# Patient Record
Sex: Male | Born: 1950 | Race: White | Hispanic: No | Marital: Married | Smoking: Never smoker
Health system: Southern US, Community
[De-identification: ages and names within clinical notes are randomized; demographics above are authoritative.]

## PROBLEM LIST (undated history)

## (undated) DIAGNOSIS — E7800 Pure hypercholesterolemia, unspecified: Secondary | ICD-10-CM

## (undated) DIAGNOSIS — F32A Depression, unspecified: Secondary | ICD-10-CM

## (undated) DIAGNOSIS — M1712 Unilateral primary osteoarthritis, left knee: Secondary | ICD-10-CM

## (undated) DIAGNOSIS — E119 Type 2 diabetes mellitus without complications: Secondary | ICD-10-CM

## (undated) DIAGNOSIS — F329 Major depressive disorder, single episode, unspecified: Secondary | ICD-10-CM

## (undated) DIAGNOSIS — E781 Pure hyperglyceridemia: Secondary | ICD-10-CM

## (undated) DIAGNOSIS — F419 Anxiety disorder, unspecified: Secondary | ICD-10-CM

## (undated) DIAGNOSIS — Z87442 Personal history of urinary calculi: Secondary | ICD-10-CM

## (undated) DIAGNOSIS — C4490 Unspecified malignant neoplasm of skin, unspecified: Secondary | ICD-10-CM

## (undated) DIAGNOSIS — L03115 Cellulitis of right lower limb: Secondary | ICD-10-CM

## (undated) DIAGNOSIS — R350 Frequency of micturition: Secondary | ICD-10-CM

## (undated) DIAGNOSIS — I10 Essential (primary) hypertension: Secondary | ICD-10-CM

## (undated) HISTORY — PX: ESOPHAGOGASTRODUODENOSCOPY: SHX1529

## (undated) HISTORY — PX: KNEE SURGERY: SHX244

## (undated) HISTORY — PX: CARDIAC CATHETERIZATION: SHX172

## (undated) HISTORY — PX: EYE SURGERY: SHX253

## (undated) HISTORY — DX: Unilateral primary osteoarthritis, left knee: M17.12

## (undated) HISTORY — PX: KIDNEY STONE SURGERY: SHX686

---

## 2001-03-10 ENCOUNTER — Encounter: Payer: Self-pay | Admitting: Family Medicine

## 2001-03-10 ENCOUNTER — Ambulatory Visit (HOSPITAL_COMMUNITY): Admission: RE | Admit: 2001-03-10 | Discharge: 2001-03-10 | Payer: Self-pay | Admitting: Family Medicine

## 2002-01-06 ENCOUNTER — Ambulatory Visit (HOSPITAL_COMMUNITY): Admission: RE | Admit: 2002-01-06 | Discharge: 2002-01-06 | Payer: Self-pay | Admitting: General Surgery

## 2002-03-31 ENCOUNTER — Other Ambulatory Visit: Admission: RE | Admit: 2002-03-31 | Discharge: 2002-03-31 | Payer: Self-pay | Admitting: General Surgery

## 2002-06-16 ENCOUNTER — Ambulatory Visit (HOSPITAL_COMMUNITY): Admission: RE | Admit: 2002-06-16 | Discharge: 2002-06-16 | Payer: Self-pay | Admitting: Cardiology

## 2002-06-16 ENCOUNTER — Encounter: Payer: Self-pay | Admitting: Cardiology

## 2003-04-26 ENCOUNTER — Encounter: Payer: Self-pay | Admitting: *Deleted

## 2003-04-26 ENCOUNTER — Emergency Department (HOSPITAL_COMMUNITY): Admission: EM | Admit: 2003-04-26 | Discharge: 2003-04-26 | Payer: Self-pay | Admitting: Emergency Medicine

## 2003-04-30 ENCOUNTER — Ambulatory Visit (HOSPITAL_COMMUNITY): Admission: RE | Admit: 2003-04-30 | Discharge: 2003-04-30 | Payer: Self-pay | Admitting: General Surgery

## 2003-04-30 ENCOUNTER — Encounter: Payer: Self-pay | Admitting: General Surgery

## 2003-04-30 ENCOUNTER — Encounter: Payer: Self-pay | Admitting: Family Medicine

## 2003-04-30 ENCOUNTER — Ambulatory Visit (HOSPITAL_COMMUNITY): Admission: RE | Admit: 2003-04-30 | Discharge: 2003-04-30 | Payer: Self-pay | Admitting: Family Medicine

## 2003-05-06 ENCOUNTER — Ambulatory Visit (HOSPITAL_COMMUNITY): Admission: RE | Admit: 2003-05-06 | Discharge: 2003-05-06 | Payer: Self-pay | Admitting: General Surgery

## 2003-08-12 ENCOUNTER — Ambulatory Visit (HOSPITAL_COMMUNITY): Admission: RE | Admit: 2003-08-12 | Discharge: 2003-08-12 | Payer: Self-pay | Admitting: Cardiology

## 2004-11-07 ENCOUNTER — Ambulatory Visit (HOSPITAL_COMMUNITY): Admission: RE | Admit: 2004-11-07 | Discharge: 2004-11-07 | Payer: Self-pay | Admitting: *Deleted

## 2004-11-07 ENCOUNTER — Ambulatory Visit: Payer: Self-pay | Admitting: *Deleted

## 2004-11-10 ENCOUNTER — Inpatient Hospital Stay (HOSPITAL_BASED_OUTPATIENT_CLINIC_OR_DEPARTMENT_OTHER): Admission: RE | Admit: 2004-11-10 | Discharge: 2004-11-10 | Payer: Self-pay | Admitting: Cardiology

## 2004-11-10 ENCOUNTER — Ambulatory Visit: Payer: Self-pay | Admitting: Cardiology

## 2004-11-24 ENCOUNTER — Ambulatory Visit: Payer: Self-pay | Admitting: *Deleted

## 2007-09-26 ENCOUNTER — Ambulatory Visit (HOSPITAL_COMMUNITY): Admission: RE | Admit: 2007-09-26 | Discharge: 2007-09-26 | Payer: Self-pay | Admitting: General Surgery

## 2007-09-26 ENCOUNTER — Encounter (INDEPENDENT_AMBULATORY_CARE_PROVIDER_SITE_OTHER): Payer: Self-pay | Admitting: General Surgery

## 2008-11-29 ENCOUNTER — Emergency Department (HOSPITAL_COMMUNITY): Admission: EM | Admit: 2008-11-29 | Discharge: 2008-11-29 | Payer: Self-pay | Admitting: Emergency Medicine

## 2008-12-17 ENCOUNTER — Ambulatory Visit: Payer: Self-pay | Admitting: Cardiovascular Disease

## 2009-02-14 ENCOUNTER — Encounter: Payer: Self-pay | Admitting: Cardiovascular Disease

## 2009-03-02 ENCOUNTER — Ambulatory Visit (HOSPITAL_COMMUNITY): Admission: RE | Admit: 2009-03-02 | Discharge: 2009-03-02 | Payer: Self-pay | Admitting: Family Medicine

## 2009-03-10 ENCOUNTER — Telehealth: Payer: Self-pay | Admitting: Cardiovascular Disease

## 2009-03-11 ENCOUNTER — Emergency Department (HOSPITAL_COMMUNITY): Admission: EM | Admit: 2009-03-11 | Discharge: 2009-03-11 | Payer: Self-pay | Admitting: Emergency Medicine

## 2009-03-14 ENCOUNTER — Ambulatory Visit: Payer: Self-pay | Admitting: Cardiology

## 2009-03-15 ENCOUNTER — Encounter (INDEPENDENT_AMBULATORY_CARE_PROVIDER_SITE_OTHER): Payer: Self-pay | Admitting: *Deleted

## 2009-03-18 ENCOUNTER — Telehealth: Payer: Self-pay | Admitting: Gastroenterology

## 2009-03-21 ENCOUNTER — Encounter: Payer: Self-pay | Admitting: Cardiovascular Disease

## 2009-03-23 ENCOUNTER — Encounter: Payer: Self-pay | Admitting: Gastroenterology

## 2009-03-23 ENCOUNTER — Ambulatory Visit (HOSPITAL_COMMUNITY): Admission: RE | Admit: 2009-03-23 | Discharge: 2009-03-23 | Payer: Self-pay | Admitting: Gastroenterology

## 2009-03-23 ENCOUNTER — Ambulatory Visit: Payer: Self-pay | Admitting: Gastroenterology

## 2009-03-23 DIAGNOSIS — R630 Anorexia: Secondary | ICD-10-CM

## 2009-03-23 DIAGNOSIS — R12 Heartburn: Secondary | ICD-10-CM

## 2009-03-23 DIAGNOSIS — E785 Hyperlipidemia, unspecified: Secondary | ICD-10-CM | POA: Insufficient documentation

## 2009-03-23 DIAGNOSIS — R634 Abnormal weight loss: Secondary | ICD-10-CM

## 2009-03-23 DIAGNOSIS — K449 Diaphragmatic hernia without obstruction or gangrene: Secondary | ICD-10-CM | POA: Insufficient documentation

## 2009-03-23 DIAGNOSIS — K7689 Other specified diseases of liver: Secondary | ICD-10-CM

## 2009-03-23 DIAGNOSIS — I10 Essential (primary) hypertension: Secondary | ICD-10-CM | POA: Insufficient documentation

## 2009-03-23 DIAGNOSIS — K219 Gastro-esophageal reflux disease without esophagitis: Secondary | ICD-10-CM

## 2009-03-23 DIAGNOSIS — R0789 Other chest pain: Secondary | ICD-10-CM

## 2009-03-23 DIAGNOSIS — G4730 Sleep apnea, unspecified: Secondary | ICD-10-CM | POA: Insufficient documentation

## 2009-03-23 DIAGNOSIS — F329 Major depressive disorder, single episode, unspecified: Secondary | ICD-10-CM

## 2009-03-23 DIAGNOSIS — K5900 Constipation, unspecified: Secondary | ICD-10-CM | POA: Insufficient documentation

## 2009-03-23 DIAGNOSIS — R1012 Left upper quadrant pain: Secondary | ICD-10-CM | POA: Insufficient documentation

## 2009-03-23 DIAGNOSIS — E119 Type 2 diabetes mellitus without complications: Secondary | ICD-10-CM

## 2009-03-24 ENCOUNTER — Encounter: Payer: Self-pay | Admitting: Gastroenterology

## 2009-03-25 ENCOUNTER — Encounter: Payer: Self-pay | Admitting: Cardiovascular Disease

## 2009-03-25 ENCOUNTER — Ambulatory Visit: Payer: Self-pay | Admitting: Gastroenterology

## 2009-03-28 ENCOUNTER — Encounter: Payer: Self-pay | Admitting: Gastroenterology

## 2009-03-29 ENCOUNTER — Inpatient Hospital Stay (HOSPITAL_COMMUNITY): Admission: EM | Admit: 2009-03-29 | Discharge: 2009-03-31 | Payer: Self-pay | Admitting: Emergency Medicine

## 2009-03-29 ENCOUNTER — Telehealth: Payer: Self-pay | Admitting: Gastroenterology

## 2009-03-29 ENCOUNTER — Ambulatory Visit: Payer: Self-pay | Admitting: Internal Medicine

## 2009-04-08 ENCOUNTER — Ambulatory Visit: Payer: Self-pay | Admitting: Cardiovascular Disease

## 2009-04-22 ENCOUNTER — Ambulatory Visit (HOSPITAL_COMMUNITY): Payer: Self-pay | Admitting: Psychology

## 2009-04-26 ENCOUNTER — Ambulatory Visit (HOSPITAL_COMMUNITY): Admission: RE | Admit: 2009-04-26 | Discharge: 2009-04-26 | Payer: Self-pay | Admitting: Orthopedic Surgery

## 2009-05-19 ENCOUNTER — Ambulatory Visit: Payer: Self-pay | Admitting: Pulmonary Disease

## 2009-05-19 ENCOUNTER — Telehealth: Payer: Self-pay | Admitting: Pulmonary Disease

## 2009-05-19 DIAGNOSIS — G4700 Insomnia, unspecified: Secondary | ICD-10-CM | POA: Insufficient documentation

## 2009-05-23 ENCOUNTER — Encounter: Payer: Self-pay | Admitting: Pulmonary Disease

## 2009-05-25 ENCOUNTER — Ambulatory Visit (HOSPITAL_COMMUNITY): Admission: RE | Admit: 2009-05-25 | Discharge: 2009-05-25 | Payer: Self-pay | Admitting: Family Medicine

## 2009-07-07 ENCOUNTER — Ambulatory Visit: Payer: Self-pay | Admitting: Gastroenterology

## 2009-07-07 DIAGNOSIS — R198 Other specified symptoms and signs involving the digestive system and abdomen: Secondary | ICD-10-CM

## 2009-07-11 ENCOUNTER — Ambulatory Visit (HOSPITAL_COMMUNITY): Admission: RE | Admit: 2009-07-11 | Discharge: 2009-07-11 | Payer: Self-pay | Admitting: Gastroenterology

## 2009-07-11 ENCOUNTER — Ambulatory Visit: Payer: Self-pay | Admitting: Gastroenterology

## 2009-07-13 ENCOUNTER — Encounter: Payer: Self-pay | Admitting: Gastroenterology

## 2009-08-31 ENCOUNTER — Telehealth (INDEPENDENT_AMBULATORY_CARE_PROVIDER_SITE_OTHER): Payer: Self-pay | Admitting: *Deleted

## 2009-09-05 ENCOUNTER — Inpatient Hospital Stay (HOSPITAL_COMMUNITY): Admission: RE | Admit: 2009-09-05 | Discharge: 2009-09-07 | Payer: Self-pay | Admitting: Orthopedic Surgery

## 2009-09-10 ENCOUNTER — Ambulatory Visit: Payer: Self-pay | Admitting: Surgery

## 2009-09-10 ENCOUNTER — Encounter: Payer: Self-pay | Admitting: Internal Medicine

## 2009-09-10 ENCOUNTER — Inpatient Hospital Stay (HOSPITAL_COMMUNITY): Admission: EM | Admit: 2009-09-10 | Discharge: 2009-09-15 | Payer: Self-pay | Admitting: Emergency Medicine

## 2009-09-15 ENCOUNTER — Ambulatory Visit: Payer: Self-pay | Admitting: Infectious Diseases

## 2009-10-26 ENCOUNTER — Encounter (HOSPITAL_COMMUNITY): Admission: RE | Admit: 2009-10-26 | Discharge: 2009-11-25 | Payer: Self-pay | Admitting: Orthopedic Surgery

## 2009-11-28 ENCOUNTER — Encounter (HOSPITAL_COMMUNITY): Admission: RE | Admit: 2009-11-28 | Discharge: 2009-12-28 | Payer: Self-pay | Admitting: Orthopedic Surgery

## 2010-10-16 ENCOUNTER — Other Ambulatory Visit
Admission: RE | Admit: 2010-10-16 | Discharge: 2010-10-16 | Payer: Self-pay | Source: Home / Self Care | Admitting: General Surgery

## 2010-10-29 ENCOUNTER — Encounter: Payer: Self-pay | Admitting: Cardiovascular Disease

## 2010-10-29 ENCOUNTER — Encounter: Payer: Self-pay | Admitting: Family Medicine

## 2010-10-30 ENCOUNTER — Encounter: Payer: Self-pay | Admitting: Internal Medicine

## 2011-01-09 LAB — URINALYSIS, ROUTINE W REFLEX MICROSCOPIC
Bilirubin Urine: NEGATIVE
Ketones, ur: NEGATIVE mg/dL
Nitrite: NEGATIVE
pH: 6 (ref 5.0–8.0)

## 2011-01-09 LAB — GLUCOSE, CAPILLARY
Glucose-Capillary: 101 mg/dL — ABNORMAL HIGH (ref 70–99)
Glucose-Capillary: 102 mg/dL — ABNORMAL HIGH (ref 70–99)
Glucose-Capillary: 104 mg/dL — ABNORMAL HIGH (ref 70–99)
Glucose-Capillary: 109 mg/dL — ABNORMAL HIGH (ref 70–99)
Glucose-Capillary: 110 mg/dL — ABNORMAL HIGH (ref 70–99)
Glucose-Capillary: 113 mg/dL — ABNORMAL HIGH (ref 70–99)
Glucose-Capillary: 115 mg/dL — ABNORMAL HIGH (ref 70–99)
Glucose-Capillary: 121 mg/dL — ABNORMAL HIGH (ref 70–99)
Glucose-Capillary: 124 mg/dL — ABNORMAL HIGH (ref 70–99)
Glucose-Capillary: 128 mg/dL — ABNORMAL HIGH (ref 70–99)
Glucose-Capillary: 156 mg/dL — ABNORMAL HIGH (ref 70–99)
Glucose-Capillary: 87 mg/dL (ref 70–99)
Glucose-Capillary: 97 mg/dL (ref 70–99)

## 2011-01-09 LAB — BASIC METABOLIC PANEL
BUN: 4 mg/dL — ABNORMAL LOW (ref 6–23)
BUN: 8 mg/dL (ref 6–23)
CO2: 25 mEq/L (ref 19–32)
Calcium: 8.6 mg/dL (ref 8.4–10.5)
Chloride: 108 mEq/L (ref 96–112)
Chloride: 110 mEq/L (ref 96–112)
GFR calc Af Amer: >60 mL/min (ref >60)
GFR calc non Af Amer: >60 mL/min (ref >60)
Glucose, Bld: 104 mg/dL — ABNORMAL HIGH (ref 70–99)
Glucose, Bld: 125 mg/dL — ABNORMAL HIGH (ref 70–99)
Glucose, Bld: 80 mg/dL (ref 70–99)
Potassium: 3.2 mEq/L — ABNORMAL LOW (ref 3.5–5.1)
Potassium: 3.6 mEq/L (ref 3.5–5.1)
Potassium: 3.8 mEq/L (ref 3.5–5.1)
Potassium: 4.1 mEq/L (ref 3.5–5.1)
Sodium: 135 mEq/L (ref 135–145)
Sodium: 136 mEq/L (ref 135–145)
Sodium: 142 mEq/L (ref 135–145)

## 2011-01-09 LAB — CBC
HCT: 27.9 % — ABNORMAL LOW (ref 39.0–52.0)
HCT: 28.4 % — ABNORMAL LOW (ref 39.0–52.0)
HCT: 30.2 % — ABNORMAL LOW (ref 39.0–52.0)
HCT: 31.9 % — ABNORMAL LOW (ref 39.0–52.0)
HCT: 36.1 % — ABNORMAL LOW (ref 39.0–52.0)
Hemoglobin: 11.1 g/dL — ABNORMAL LOW (ref 13.0–17.0)
Hemoglobin: 12.7 g/dL — ABNORMAL LOW (ref 13.0–17.0)
Hemoglobin: 9.5 g/dL — ABNORMAL LOW (ref 13.0–17.0)
Hemoglobin: 9.7 g/dL — ABNORMAL LOW (ref 13.0–17.0)
Hemoglobin: 9.7 g/dL — ABNORMAL LOW (ref 13.0–17.0)
MCHC: 34.8 g/dL (ref 30.0–36.0)
MCHC: 35.2 g/dL (ref 30.0–36.0)
MCV: 92.3 fL (ref 78.0–100.0)
MCV: 92.6 fL (ref 78.0–100.0)
MCV: 94.4 fL (ref 78.0–100.0)
Platelets: 235 10*3/uL (ref 150–400)
Platelets: 346 10*3/uL (ref 150–400)
Platelets: 363 10*3/uL (ref 150–400)
RBC: 3.01 MIL/uL — ABNORMAL LOW (ref 4.22–5.81)
RBC: 3.02 MIL/uL — ABNORMAL LOW (ref 4.22–5.81)
RBC: 3.4 MIL/uL — ABNORMAL LOW (ref 4.22–5.81)
RDW: 13.2 % (ref 11.5–15.5)
RDW: 13.4 % (ref 11.5–15.5)
RDW: 13.4 % (ref 11.5–15.5)
RDW: 13.6 % (ref 11.5–15.5)
WBC: 6.8 10*3/uL (ref 4.0–10.5)
WBC: 8.2 10*3/uL (ref 4.0–10.5)
WBC: 8.3 10*3/uL (ref 4.0–10.5)

## 2011-01-09 LAB — DIFFERENTIAL
Basophils Absolute: 0 10*3/uL (ref 0.0–0.1)
Basophils Absolute: 0.1 10*3/uL (ref 0.0–0.1)
Basophils Absolute: 0.1 10*3/uL (ref 0.0–0.1)
Basophils Relative: 1 % (ref 0–1)
Basophils Relative: 1 % (ref 0–1)
Eosinophils Absolute: 0.2 10*3/uL (ref 0.0–0.7)
Eosinophils Absolute: 0.2 10*3/uL (ref 0.0–0.7)
Eosinophils Relative: 2 % (ref 0–5)
Eosinophils Relative: 3 % (ref 0–5)
Eosinophils Relative: 3 % (ref 0–5)
Lymphocytes Relative: 20 % (ref 12–46)
Lymphs Abs: 1.7 10*3/uL (ref 0.7–4.0)
Lymphs Abs: 1.8 10*3/uL (ref 0.7–4.0)
Monocytes Absolute: 0.6 10*3/uL (ref 0.1–1.0)
Monocytes Absolute: 0.6 10*3/uL (ref 0.1–1.0)
Monocytes Relative: 7 % (ref 3–12)
Monocytes Relative: 8 % (ref 3–12)
Neutro Abs: 4.9 10*3/uL (ref 1.7–7.7)
Neutro Abs: 5.4 10*3/uL (ref 1.7–7.7)
Neutro Abs: 5.6 10*3/uL (ref 1.7–7.7)

## 2011-01-09 LAB — SEDIMENTATION RATE: Sed Rate: 92 mm/hr — ABNORMAL HIGH (ref 0–16)

## 2011-01-09 LAB — COMPREHENSIVE METABOLIC PANEL
ALT: 45 U/L (ref 0–53)
Albumin: 2.7 g/dL — ABNORMAL LOW (ref 3.5–5.2)
Alkaline Phosphatase: 37 U/L — ABNORMAL LOW (ref 39–117)
Chloride: 104 mEq/L (ref 96–112)
Glucose, Bld: 110 mg/dL — ABNORMAL HIGH (ref 70–99)
Potassium: 3.5 mEq/L (ref 3.5–5.1)
Sodium: 138 mEq/L (ref 135–145)
Total Bilirubin: 0.9 mg/dL (ref 0.3–1.2)
Total Protein: 5.7 g/dL — ABNORMAL LOW (ref 6.0–8.3)

## 2011-01-09 LAB — CULTURE, BLOOD (ROUTINE X 2): Culture: NO GROWTH

## 2011-01-09 LAB — HIGH SENSITIVITY CRP: CRP, High Sensitivity: 141.2 mg/L — ABNORMAL HIGH

## 2011-01-09 LAB — URINE MICROSCOPIC-ADD ON

## 2011-01-09 LAB — CLOSTRIDIUM DIFFICILE EIA

## 2011-01-09 LAB — VANCOMYCIN, TROUGH: Vancomycin Tr: 13.5 ug/mL (ref 10.0–20.0)

## 2011-01-09 LAB — APTT: aPTT: 44 seconds — ABNORMAL HIGH (ref 24–37)

## 2011-01-09 LAB — URINE CULTURE

## 2011-01-09 LAB — PROTIME-INR
INR: 1.92 — ABNORMAL HIGH (ref 0.00–1.49)
Prothrombin Time: 21.8 seconds — ABNORMAL HIGH (ref 11.6–15.2)

## 2011-01-09 LAB — HEMOGLOBIN A1C: Hgb A1c MFr Bld: 6 % (ref 4.6–6.1)

## 2011-01-10 LAB — DIFFERENTIAL
Basophils Absolute: 0.2 10*3/uL — ABNORMAL HIGH (ref 0.0–0.1)
Lymphocytes Relative: 28 % (ref 12–46)
Monocytes Absolute: 0.8 10*3/uL (ref 0.1–1.0)
Neutro Abs: 7.1 10*3/uL (ref 1.7–7.7)

## 2011-01-10 LAB — URINALYSIS, ROUTINE W REFLEX MICROSCOPIC
Glucose, UA: NEGATIVE mg/dL
Ketones, ur: NEGATIVE mg/dL
Ketones, ur: NEGATIVE mg/dL
Nitrite: NEGATIVE
Nitrite: NEGATIVE
Protein, ur: 30 mg/dL — AB
Protein, ur: NEGATIVE mg/dL
Urobilinogen, UA: 1 mg/dL (ref 0.0–1.0)
Urobilinogen, UA: 1 mg/dL (ref 0.0–1.0)
pH: 6 (ref 5.0–8.0)

## 2011-01-10 LAB — ABO/RH: ABO/RH(D): A POS

## 2011-01-10 LAB — COMPREHENSIVE METABOLIC PANEL
Albumin: 4.2 g/dL (ref 3.5–5.2)
BUN: 17 mg/dL (ref 6–23)
Chloride: 107 mEq/L (ref 96–112)
Creatinine, Ser: 1 mg/dL (ref 0.4–1.5)
Total Bilirubin: 0.6 mg/dL (ref 0.3–1.2)

## 2011-01-10 LAB — CBC
HCT: 36.4 % — ABNORMAL LOW (ref 39.0–52.0)
HCT: 41.7 % (ref 39.0–52.0)
Hemoglobin: 12.6 g/dL — ABNORMAL LOW (ref 13.0–17.0)
MCHC: 34.3 g/dL (ref 30.0–36.0)
MCV: 93.2 fL (ref 78.0–100.0)
MCV: 93.5 fL (ref 78.0–100.0)
Platelets: 224 10*3/uL (ref 150–400)
Platelets: 295 10*3/uL (ref 150–400)
RDW: 13.7 % (ref 11.5–15.5)
RDW: 13.7 % (ref 11.5–15.5)

## 2011-01-10 LAB — GLUCOSE, CAPILLARY
Glucose-Capillary: 115 mg/dL — ABNORMAL HIGH (ref 70–99)
Glucose-Capillary: 115 mg/dL — ABNORMAL HIGH (ref 70–99)
Glucose-Capillary: 98 mg/dL (ref 70–99)
Glucose-Capillary: 99 mg/dL (ref 70–99)
Glucose-Capillary: 99 mg/dL (ref 70–99)

## 2011-01-10 LAB — URINE MICROSCOPIC-ADD ON

## 2011-01-10 LAB — APTT: aPTT: 27 seconds (ref 24–37)

## 2011-01-10 LAB — URINE CULTURE
Colony Count: NO GROWTH
Culture: NO GROWTH

## 2011-01-10 LAB — BASIC METABOLIC PANEL
BUN: 11 mg/dL (ref 6–23)
Chloride: 103 mEq/L (ref 96–112)
Glucose, Bld: 134 mg/dL — ABNORMAL HIGH (ref 70–99)
Potassium: 3.9 mEq/L (ref 3.5–5.1)

## 2011-01-10 LAB — CULTURE, BLOOD (ROUTINE X 2)

## 2011-01-10 LAB — PROTIME-INR: Prothrombin Time: 13 seconds (ref 11.6–15.2)

## 2011-01-10 LAB — TYPE AND SCREEN: Antibody Screen: NEGATIVE

## 2011-01-14 LAB — URINALYSIS, ROUTINE W REFLEX MICROSCOPIC
Ketones, ur: NEGATIVE mg/dL
Nitrite: NEGATIVE
Protein, ur: NEGATIVE mg/dL
pH: 5.5 (ref 5.0–8.0)

## 2011-01-14 LAB — CBC
HCT: 39.9 % (ref 39.0–52.0)
MCHC: 34.4 g/dL (ref 30.0–36.0)
MCV: 91.2 fL (ref 78.0–100.0)
Platelets: 339 10*3/uL (ref 150–400)
RDW: 13 % (ref 11.5–15.5)
WBC: 15.3 10*3/uL — ABNORMAL HIGH (ref 4.0–10.5)

## 2011-01-14 LAB — COMPREHENSIVE METABOLIC PANEL
Albumin: 4 g/dL (ref 3.5–5.2)
BUN: 13 mg/dL (ref 6–23)
Calcium: 9.7 mg/dL (ref 8.4–10.5)
Chloride: 104 mEq/L (ref 96–112)
Creatinine, Ser: 1.08 mg/dL (ref 0.4–1.5)
GFR calc Af Amer: >60 mL/min (ref >60)
Total Bilirubin: 0.5 mg/dL (ref 0.3–1.2)

## 2011-01-14 LAB — DIFFERENTIAL
Basophils Absolute: 0.1 10*3/uL (ref 0.0–0.1)
Lymphocytes Relative: 23 % (ref 12–46)
Lymphs Abs: 3.4 10*3/uL (ref 0.7–4.0)
Monocytes Absolute: 0.7 10*3/uL (ref 0.1–1.0)
Neutro Abs: 10.8 10*3/uL — ABNORMAL HIGH (ref 1.7–7.7)

## 2011-01-14 LAB — PROTIME-INR: Prothrombin Time: 13.3 seconds (ref 11.6–15.2)

## 2011-01-14 LAB — APTT: aPTT: 26 seconds (ref 24–37)

## 2011-01-15 LAB — POCT I-STAT 3, VENOUS BLOOD GAS (G3P V)
Bicarbonate: 24.9 mEq/L — ABNORMAL HIGH (ref 20.0–24.0)
pH, Ven: 7.365 — ABNORMAL HIGH (ref 7.250–7.300)

## 2011-01-15 LAB — DIFFERENTIAL
Eosinophils Absolute: 0.2 10*3/uL (ref 0.0–0.7)
Eosinophils Relative: 1 % (ref 0–5)
Eosinophils Relative: 1 % (ref 0–5)
Lymphocytes Relative: 25 % (ref 12–46)
Lymphs Abs: 3.1 10*3/uL (ref 0.7–4.0)
Lymphs Abs: 3.1 10*3/uL (ref 0.7–4.0)
Monocytes Absolute: 0.8 10*3/uL (ref 0.1–1.0)
Monocytes Relative: 6 % (ref 3–12)
Monocytes Relative: 6 % (ref 3–12)

## 2011-01-15 LAB — COMPREHENSIVE METABOLIC PANEL
AST: 28 U/L (ref 0–37)
Albumin: 3.7 g/dL (ref 3.5–5.2)
Calcium: 9 mg/dL (ref 8.4–10.5)
Chloride: 107 mEq/L (ref 96–112)
Creatinine, Ser: 1.1 mg/dL (ref 0.4–1.5)
GFR calc Af Amer: >60 mL/min (ref >60)
Total Bilirubin: 0.3 mg/dL (ref 0.3–1.2)

## 2011-01-15 LAB — BASIC METABOLIC PANEL
Chloride: 103 mEq/L (ref 96–112)
GFR calc Af Amer: >60 mL/min (ref >60)
Potassium: 3.9 mEq/L (ref 3.5–5.1)
Sodium: 137 mEq/L (ref 135–145)

## 2011-01-15 LAB — LIPID PANEL: Triglycerides: 146 mg/dL (ref <150)

## 2011-01-15 LAB — CBC
HCT: 41.4 % (ref 39.0–52.0)
HCT: 40.7 % (ref 39.0–52.0)
Hemoglobin: 14.1 g/dL (ref 13.0–17.0)
MCV: 91.6 fL (ref 78.0–100.0)
RBC: 4.44 MIL/uL (ref 4.22–5.81)
WBC: 12.4 10*3/uL — ABNORMAL HIGH (ref 4.0–10.5)
WBC: 11.5 10*3/uL — ABNORMAL HIGH (ref 4.0–10.5)

## 2011-01-15 LAB — POCT I-STAT, CHEM 8
BUN: 12 mg/dL (ref 6–23)
Calcium, Ion: 1.16 mmol/L (ref 1.12–1.32)
Creatinine, Ser: 1 mg/dL (ref 0.4–1.5)
Glucose, Bld: 118 mg/dL — ABNORMAL HIGH (ref 70–99)
Sodium: 136 mEq/L (ref 135–145)
TCO2: 24 mmol/L (ref 0–100)

## 2011-01-15 LAB — POCT I-STAT 3, ART BLOOD GAS (G3+)
Bicarbonate: 25.6 mEq/L — ABNORMAL HIGH (ref 20.0–24.0)
pH, Arterial: 7.386 (ref 7.350–7.450)
pO2, Arterial: 100 mmHg (ref 80.0–100.0)

## 2011-01-15 LAB — GLUCOSE, CAPILLARY: Glucose-Capillary: 101 mg/dL — ABNORMAL HIGH (ref 70–99)

## 2011-01-15 LAB — CARDIAC PANEL(CRET KIN+CKTOT+MB+TROPI)
CK, MB: 1.5 ng/mL (ref 0.3–4.0)
CK, MB: 1.6 ng/mL (ref 0.3–4.0)
Troponin I: 0.01 ng/mL (ref 0.00–0.06)

## 2011-01-15 LAB — POCT CARDIAC MARKERS
Myoglobin, poc: 53.6 ng/mL (ref 12–200)
Myoglobin, poc: 75.8 ng/mL (ref 12–200)
Myoglobin, poc: 62.7 ng/mL (ref 12–200)

## 2011-01-15 LAB — APTT: aPTT: 27 seconds (ref 24–37)

## 2011-01-15 LAB — PROTIME-INR
INR: 1.1 (ref 0.00–1.49)
Prothrombin Time: 14.1 seconds (ref 11.6–15.2)

## 2011-01-15 LAB — BRAIN NATRIURETIC PEPTIDE: Pro B Natriuretic peptide (BNP): 33 pg/mL (ref 0.0–100.0)

## 2011-01-15 LAB — HEMOGLOBIN A1C: Mean Plasma Glucose: 140 mg/dL

## 2011-01-15 LAB — TSH: TSH: 1.789 u[IU]/mL (ref 0.350–4.500)

## 2011-01-23 LAB — DIFFERENTIAL
Basophils Relative: 1 % (ref 0–1)
Lymphocytes Relative: 29 % (ref 12–46)
Lymphs Abs: 3.2 10*3/uL (ref 0.7–4.0)
Monocytes Absolute: 0.7 10*3/uL (ref 0.1–1.0)
Monocytes Relative: 6 % (ref 3–12)
Neutro Abs: 7 10*3/uL (ref 1.7–7.7)

## 2011-01-23 LAB — BASIC METABOLIC PANEL
CO2: 25 mEq/L (ref 19–32)
Calcium: 9.2 mg/dL (ref 8.4–10.5)
GFR calc Af Amer: >60 mL/min (ref >60)
Sodium: 136 mEq/L (ref 135–145)

## 2011-01-23 LAB — POCT CARDIAC MARKERS
CKMB, poc: <1 ng/mL — ABNORMAL LOW (ref 1.0–8.0)
Myoglobin, poc: 60.1 ng/mL (ref 12–200)
Myoglobin, poc: 81.2 ng/mL (ref 12–200)
Troponin i, poc: <0.05 ng/mL (ref 0.00–0.09)

## 2011-01-23 LAB — CBC
Hemoglobin: 13.4 g/dL (ref 13.0–17.0)
MCHC: 34.9 g/dL (ref 30.0–36.0)
RBC: 4.26 MIL/uL (ref 4.22–5.81)
WBC: 11.2 10*3/uL — ABNORMAL HIGH (ref 4.0–10.5)

## 2011-02-20 NOTE — Op Note (Signed)
NAME:  [name], [name] NO.:  [id number]   MEDICAL RECORD NO.:  [id number]          PATIENT TYPE:  AMB   LOCATION:  DAY                           FACILITY:  [facility]   PHYSICIAN:  Kassie Mends, M.D.      DATE OF BIRTH:  Feb 27, 1951   DATE OF PROCEDURE:  03/23/2009  DATE OF DISCHARGE:  03/23/2009                               OPERATIVE REPORT   PROCEDURE:  Esophagogastroduodenoscopy with cold forceps biopsy of the  gastric mucosa and Bravo capsule placement at 34 cm from the teeth.   INDICATION FOR EXAM:  [name] is a 60 year old male who had stress  and depression and lost 30 pounds unintentionally.  He regularly used to  Mobic and aspirin.  He has taken Nexium twice a day and Carafate 4 times  a day.  He also takes Prilosec one tablet every morning.   FINDINGS:  1. Normal esophagus without evidence of Barrett's, mass, erosion,      ulceration, or stricture.  His GE junction is located 40 cm from      the teeth.  The Bravo capsule was placed 34 cm from the teeth.  2. Patchy erythema in the body and the antrum without erosion or      ulceration.  Biopsies obtained via cold forceps to evaluate for H.      pylori gastritis.  3. Normal duodenal bulb and second portion of the duodenum.   DIAGNOSES:  1. No evidence of erosive esophagitis.  2. Mild gastritis.   RECOMMENDATIONS:  1. Will call [name] with the results of his biopsies.  2. Follow up appointment with Tana Coast or Dr. Cira Servant in 1 month.  3. He should follow a low-fat diet.  He was given a handout on a low-      fat diet as well as gastritis.  4. He should avoid aspirin and anti-inflammatory drugs for 30 days.      No anticoagulation for 7 days.   MEDICATIONS:  1. Demerol 75 mg IV.  2. Versed 6 mg IV.   PROCEDURE TECHNIQUE:  Physical exam was performed.  Informed consent was  obtained from the patient after explaining the benefits, risks, and  alternatives to the procedure.  The patient was  connected to the monitor  and placed in the left lateral position.  Continuous oxygen was provided  by nasal cannula, IV medicine administered through an indwelling  cannula.  After administration of sedation, the patient's esophagus was  intubated, and the scope was advanced under direct visualization to the  second portion of the duodenum.  The scope was removed slowly by  carefully examining the color, texture, anatomy, and integrity of the  mucosa on the way out.  The Bravo capsule was introduced to 34 cm from  the teeth.  Some suction was applied for 1 minute.  The patient's  esophagus was intubated with a diagnostic gastroscope, and the scope was  advanced under direct visualization to the Bravo  capsule where it was adhered to the sidewall of the esophagus.  The  scope and the introducer were  removed.  The patient was recovered in  endoscopy and discharged home in satisfactory condition.   PATH:  MILD GASTRITIS      Kassie Mends, M.D.  Electronically Signed     SM/MEDQ  D:  03/23/2009  T:  03/24/2009  Job:  [id number]   cc:   Patrica Duel, M.D.  Fax: [phone]

## 2011-02-20 NOTE — Discharge Summary (Signed)
NAME:  [name], [name] NO.:  [id number]   MEDICAL RECORD NO.:  [id number]          PATIENT TYPE:  OBV   LOCATION:  2020                         FACILITY:  [facility]   PHYSICIAN:  Everardo Beals. Juanda Chance, MD, FACCDATE OF BIRTH:  1951-07-20   DATE OF ADMISSION:  03/29/2009  DATE OF DISCHARGE:  03/31/2009                               DISCHARGE SUMMARY   PROCEDURES:  1. Cardiac catheterization.  2. Coronary arteriogram.  3. Left ventriculogram.  4. CT angiogram of the chest.   PRIMARY FINAL DISCHARGE DIAGNOSIS:  Chest pain, no coronary artery  disease by catheterization this admission and ejection fraction normal.   SECONDARY DIAGNOSES:  1. Status post esophagogastroduodenoscopy and Bravo pH study that      showed non-acid reflux and mild gastritis.  2. History of atypical chest pain.  3. Hypertension.  4. Hyperlipidemia.  5. Diabetes.  6. Obesity.  7. Osteoarthritis.  8. History of nephrolithiasis.  9. Depression and possibly anxiety.  10.Obstructive sleep apnea, noncompliant with CPAP as he feels it does      not help him sleep.  11.Allergy or intolerance to Augmentin.   TIME AT DISCHARGE:  33 minutes.   HOSPITAL COURSE:  [name] is a 60 year old male with a history of  chest pain and cardiac catheterization in 2006 that showed no disease.  He has been having daily chest pain for 2 months and this episode  started 2 days ago.  When his symptoms did not resolve, he went to [facility] Emergency Room (fourth ER visit in the last 30 days) where his  symptoms resolved after the administration of Ativan and trazodone.  He  was admitted by Cardiology for further evaluation.   His cardiac enzymes were negative for MI.  He has multiple cardiac risk  factors and was very concerned about his symptoms, so cardiac  catheterization was performed.  The catheterization showed no coronary  artery disease and a normal EF.   [name] had remained pain-free.  A significant  issue with him is  insomnia and he stated that Ambien CR 12.5 mg was not helping him sleep.  He was tried on Restoril and reported improvement.  No other medical  changes were made as his chest pain had resolved and was noncardiac.  If  he completes his bedrest with no further episodes of chest pain or  problems with his groin, he is tentatively considered stable for  discharge on March 31, 2009 and is to follow up with Dr. Eden Emms as an  outpatient.   DISCHARGE INSTRUCTIONS:  His activity level is to be increased gradually  with no lifting for a week and no driving for 2 days.  He is to stick to  a low-sodium diabetic diet.  He is to call our office for problems with  the cath site.  He is to follow up with Dr. Eden Emms on April 08, 2009 at  10:30 and with Dr. Nobie Putnam as needed.  He is to follow up with his  orthopedist as needed as well.   DISCHARGE MEDICATIONS:  1. Actos 45 mg  a day.  2. TriCor 145 mg a day.  3. Lisinopril 20 mg a day.  4. Enablex 7.5 mg a day.  5. Prilosec 20 mg a day.  6. Multivitamin daily.  7. Ambien CR 12.5 mg or Restoril 15 mg at bedtime as needed, short-      term prescription given and he is to follow up with Dr. Nobie Putnam.  8. Aspirin 81 mg daily.      Theodore Demark, PA-C      Bruce R. Juanda Chance, MD, [facility]  Electronically Signed    RB/MEDQ  D:  03/31/2009  T:  04/01/2009  Job:  [id number]   cc:   [facility]  Patrica Duel, M.D.

## 2011-02-20 NOTE — H&P (Signed)
NAME:  [name] NO.:  [id number]   MEDICAL RECORD NO.:  [id number]          PATIENT TYPE:  AMB   LOCATION:  DAY                           FACILITY:  [facility]   PHYSICIAN:  Dalia Heading, M.D.  DATE OF BIRTH:  1951/06/07   DATE OF ADMISSION:  DATE OF DISCHARGE:  LH                              HISTORY & PHYSICAL   CHIEF COMPLAINT:  Recurrent perineal condyloma/skin tag   HISTORY OF PRESENT ILLNESS:  The patient is a 60 year old  white male  who is referred for removal of perineal skin tags and condyloma.  He has  had previous removal, but  multiple skin tags have recurred.   PAST MEDICAL HISTORY:  Includes hypertension.   PAST SURGICAL HISTORY:  EGD, colonoscopy, kidney stone removal.   CURRENT MEDICATIONS:  Tricor, Actos, lisinopril, baby aspirin.   ALLERGIES:  AUGMENTIN   REVIEW OF SYSTEMS:  Noncontributory.   On physical examination, the patient is a well-developed, well-nourished  white male in no acute distress.  LUNGS:  Clear to auscultation with equal breath sounds bilaterally.  HEART:  Examination reveals regular rate and rhythm without S3, S4, or  murmurs.  Perineum, base of the scrotum, and upper inner thighs, multiple skin  tags and condyloma are  seen.   IMPRESSION:  Condyloma, perineum and upper extremities.   PLAN:  The patient is scheduled for laser removal of the condyloma  around the perineum and upper thighs on September 26, 2007.  Risks and  benefits of the procedure including recurrence of the condyloma were  fully explained to the patient, gave informed consent.      Dalia Heading, M.D.  Electronically Signed    MAJ/MEDQ  D:  09/11/2007  T:  09/11/2007  Job:  [id number]   cc:   Patrica Duel, M.D.  Fax: [phone]

## 2011-02-20 NOTE — Assessment & Plan Note (Signed)
[facility]                            CARDIOLOGY OFFICE NOTE   CARZELL, SALDIVAR                       MRN:          [id number]  DATE:12/17/2008                            DOB:          12-28-1950    Mr. [name] is seen today as a new patient at request of Dr. Nobie Putnam.  He has previously been seen by Dr. Antoine Poche.  He had a normal cath in  2006.  He had some atypical chest pain recently.  He was seen in the  emergency room and had relief with a GI cocktail.  He is significantly  overweight.   In general, he has mild exertional dyspnea, has not had any recurrent  chest pains.   While in the emergency room, he had no enzyme abnormalities.  He ruled  out for myocardial infarction.  EKG showed no acute changes.  He was  begun on Protonix and has not had any recurrent symptoms.   His coronary risk factors include hypertension and hypercholesterolemia.   He is also a borderline diabetic.   His past medical history is remarkable for central obesity,  hypertension, hypercholesterolemia, borderline diabetes.   Family history is noncontributory.   The patient is married.  He has 2 children.  He is sedentary.  He enjoys  woodworking.  He works for an [employer] and is primarily  behind the desk.  He does not smoke or drink.   PHYSICAL EXAMINATION:  GENERAL:  Remarkable for an obese male in no  distress.  VITAL SIGNS:  His weight is 269, blood pressure 130/80, pulse 90 and  regular, respiratory rate 14, afebrile.  HEENT:  Unremarkable.  Carotids normal without bruit.  No  lymphadenopathy, thyromegaly, or JVP elevation.  LUNGS:  Clear.  Good diaphragmatic motion.  No wheezing.  S1 and S2.  Distant heart sounds.  PMI not palpable.  ABDOMEN:  Protuberant.  Bowel sounds positive.  No AAA.  No tenderness.  No bruit.  No hepatosplenomegaly or hepatojugular reflux or tenderness.  EXTREMITIES:  Distal pulses are intact.  No edema.  NEURO:   Nonfocal, warm, and dry.  MUSCULOSKELETAL:  No muscle weakness.   EKG is normal except for some tachycardia.   MEDICATIONS:  1. TriCor 145 a day.  2. Enablex 0.5 a day.  3. Actos 45 a day.  4. Lisinopril 20 a day.  5. Zolpidem 10 a day.  6. Aspirin.  7. Fish oil.  8. Glucosamine.   IMPRESSION:  1. Atypical chest pain with multiple coronary artery risk factors      including diabetes, hypertension, and hypercholesterolemia.  Follow      up in [place] with a stress Myoview study.  2. Diabetes.  I told the patient I would take him off Actos.  He has      gained a lot of weight with this.  He is morbidly obese and has      fluid retention.  I think he would do better with Janumet or some      other medication, as he desperately needs to lose weight, and I  doubt he will be able to do this on Actos.  I told him to talk to      Dr. Nobie Putnam about this.  3. Hypertension, currently stable.  Continue current dose of      lisinopril particularly in light of its protein spurring effect      with diabetes in kidneys.  4. Hypertriglyceridemia.  Continue TriCor, lipid and liver profile per      Dr. Nobie Putnam.  As long as the patient's Myoview is nonischemic, he      can follow up with Cardiology on a p.r.n. basis.  His risk factors      continued to be as his with diabetes.  He should have a stress test      every 2-3 years.     Noralyn Pick. Eden Emms, MD, [facility]  Electronically Signed    PCN/MedQ  DD: 12/17/2008  DT: 12/17/2008  Job #: [id number]

## 2011-02-20 NOTE — Op Note (Signed)
NAME:  [name], [name] NO.:  [id number]   MEDICAL RECORD NO.:  [id number]          PATIENT TYPE:  AMB   LOCATION:  DAY                           FACILITY:  [facility]   PHYSICIAN:  Kassie Mends, M.D.      DATE OF BIRTH:  May 22, 1951   DATE OF PROCEDURE:  03/25/2009  DATE OF DISCHARGE:                               OPERATIVE REPORT   REFERRING Cord Wilczynski:  Patrica Duel, M.D.   PROCEDURE:  48-hour Bravo study.   INDICATION FOR EXAM:  Mr. [name] is a 60 year old male who presents with  symptoms of uncontrolled reflux.  He has a history of NSAID use.  He  takes Prilosec 1 tablet every morning.  In speaking with Mr. [name], he  did not take Prilosec or Nexium on day 1 of his study.  On day 2, he did  take 1 Prilosec.  He was seen in the Emergency Department for chest pain  since his procedure on June 16.  He does have a history of a stress echo  which revealed no evidence of cardiac ischemia.   FINDINGS:  On day 1, the study period lasted 22 hours and 14 minutes.  He had 71 episodes of reflux.  He had 2 episodes of reflux that lasted  greater than 5 minutes.  The longest duration of reflux was 14 minutes.  He had a pH less than 4 for 5 minutes.  Forty-two of his episodes  occurred in the postprandial state.  His 2 episodes of reflux that  lasted greater than 2 minutes occurred when he was postprandial and  upright.  His DeMeester score on day 1 was 16.4.   On day 2, his study duration was 22 hours and 59 minutes.  He had 31  episodes of reflux; 18 of those episodes occurred postprandially.  He  had no episodes that lasted greater than 5 minutes.  He had one episode  that lasted 1 minute.  His DeMeester score on day 2 was 4.3 (DeMeester  normal is less than 14.72).  His total acid reflux analysis DeMeester  score was 11.1 (DeMeester normal is less than 14.72).  His symptom-  associated probability was 89% with heartburn and 100% supine, 73% with  meals, 21.6% with chest  pain, and 64.5% other.  A symptom-association  probability of greater than 95% indicates probability that the observed  associations occur by chance less than 1% of the time.   DIAGNOSIS:  Prilosec once in the morning provides adequate suppression  for Mr. [name].  He has significant symptoms associated with being in  the supine position.  However, the number of episodes of reflux recorded  in the supine position were 6.  He appears to have nonacid reflux.  The  differential diagnosis for his continued discomfort includes nonacid  reflux, non-ulcer dyspepsia, and a Fritz likelihood of esophageal spasm.  He had an upper GI in May 2010 which showed normal esophageal motility.   RECOMMENDATIONS:  He should continue the Prilosec 20 mg prior to his  first meal in the morning.  Consider baclofen twice a day.  He has a  followup appointment to see me on July 27th.  I discussed these  recommendations with Mr. [name].      Kassie Mends, M.D.  Electronically Signed     SM/MEDQ  D:  03/29/2009  T:  03/29/2009  Job:  [id number]   cc:   Patrica Duel, M.D.  Fax: [phone]

## 2011-02-20 NOTE — Cardiovascular Report (Signed)
NAME:  [name], [name] NO.:  [id number]   MEDICAL RECORD NO.:  [id number]          PATIENT TYPE:  OBV   LOCATION:  2020                         FACILITY:  [facility]   PHYSICIAN:  Everardo Beals. Juanda Chance, MD, FACCDATE OF BIRTH:  1951/08/27   DATE OF PROCEDURE:  03/31/2009  DATE OF DISCHARGE:  03/31/2009                            CARDIAC CATHETERIZATION   CLINICAL HISTORY:  [name] is a 60 year old, and was admitted with  chest pain and shortness of breath.  He has a history of sleep apnea and  has been evaluated for acid reflux.   PROCEDURE:  The procedure was performed by the right femoral vein using  venous sheath and Swan-Ganz thermodilution catheter.  Left heart  catheterization was performed percutaneously via the right femoral  artery using arterial sheath and 5-French preformed coronary catheters.  A front wall arterial puncture was used, and Omnipaque contrast was  used.  The patient tolerated the procedure well and left the laboratory  in satisfactory condition.   RESULTS:  The left main coronary artery:  The left main coronary artery  was free of significant disease.   The left anterior descending artery:  The left anterior descending  artery gave rise to a small and large diagonal branch and several septal  perforators.  These and LAD proper were free of significant disease.   The circumflex artery:  The circumflex artery gave rise to a marginal  branch and a posterolateral branch.  These vessels were free of  significant disease.   The right coronary artery:  The right coronary artery was a large  vessel, gave rise to posterior descending branch and 4 posterolateral  branches.  There was 40% narrowing in the proximal right coronary  artery.  This vessel had mild irregularities.   The left ventriculogram: The left ventriculogram performed in the RAO  projection showed good wall motion with no areas of hypokinesis.  The  estimated ejection fraction was  60%.   HEMODYNAMIC DATA:  The right atrial pressure was 6 mean.  Pulmonary  artery pressure was 25/5 with a mean of 14.  Pulmonary wedge pressure  was 6.   CONCLUSION:  1. Mild nonobstructive coronary artery disease with 40% narrowing in      the proximal right coronary artery and no significant obstruction      in the left anterior descending and circumflex arteries.  2. Normal pulmonary artery pressures.   RECOMMENDATIONS:  In view of these findings, I think it is not likely  the patient's chest pain and shortness of breath are cardiac in  etiology.  He is undergoing evaluation for acid reflux at present.  I  think we can clear him from a cardiac standpoint for knee replacement  surgery.      Bruce Elvera Lennox Juanda Chance, MD, [facility]  Electronically Signed     BRB/MEDQ  D:  03/31/2009  T:  04/01/2009  Job:  [id number]   cc:   Patrica Duel, M.D.  Rollene Rotunda, MD, [facility]  Robert A. Thurston Hole, M.D.

## 2011-02-20 NOTE — H&P (Signed)
NAME:  [name], [name] NO.:  [id number]   MEDICAL RECORD NO.:  [id number]          PATIENT TYPE:  OBV   LOCATION:  2020                         FACILITY:  [facility]   PHYSICIAN:  Duke Salvia, MD, FACCDATE OF BIRTH:  [birth year]   DATE OF ADMISSION:  03/29/2009  DATE OF DISCHARGE:                              HISTORY & PHYSICAL   PRIMARY CARE PHYSICIAN:  Patrica Duel, MD   PRIMARY CARDIOLOGIST:  Noralyn Pick. Eden Emms, MD, [facility]   CHIEF COMPLAINT:  Chest pain.   HISTORY OF PRESENT ILLNESS:  [name] is a 60 year old male with no  history of coronary artery disease.  He has been having chest pain on a  daily basis for approximately the last 2 months.  He describes it as a  burning.  He has been to the [facility] and [facility] emergency rooms for  a total of 3 times with a GI cocktail helping and the final diagnosis  being noncardiac chest pain.  He had a recent GI eval including an  endoscopy that showed mild gastritis and a 48-hour pH monitor that  showed non-acid reflux.  He saw Dr. Eden Emms for chest pain in March 2010  and a Myoview was recommended, but he never had it.  He came to the  [facility] Emergency Room today because his chest pain was recurrent.  He stated it radiated up into his left shoulder, neck, and back and was  worse than usual at 5/10.  It started the morning of March 28, 2009, and  was continuous since then until he came to the emergency room on March 29, 2009, at 10 p.m.  Until then, the only medication he had taken was  Prilosec which was no help.  In the emergency room yesterday, he  received Ativan and trazodone and his symptoms were relieved.  He has  been pain free since then.  Of note, the symptoms are not exertional and  are possibly worsened by activity in his left upper extremity.  He is  also complaining of some soreness in his left upper chest area.  On a  daily basis, he states that he does not necessarily wake up with chest  pain, but  will develop it during the day and it is at its worst late in  the afternoon.  Currently, he is resting comfortably.   PAST MEDICAL HISTORY:  1. Status post cardiac catheterization in 2006 with normal coronary      arteries and normal EF.  2. Status post EGD and Bravo pH study recently that showed non-acid      reflux and mild gastritis.  3. History of atypical chest pain.  4. Hypertension.  5. Hyperlipidemia.  6. Diabetes.  7. Obesity.  8. Osteoarthritis.  9. History of nephrolithiasis.  10.Depression and possibly anxiety.  11.Obstructive sleep apnea, on CPAP.   SURGICAL HISTORY:  He is status post cardiac catheterization as well as  EGD and colonoscopy and kidney stone removal.   ALLERGIES:  AUGMENTIN.   CURRENT MEDICATIONS:  1. Actos 45 mg a day.  2. TriCor 145  mg a day.  3. Lisinopril 20 mg a day.  4. Enablex 7.5 mg daily.  5. Prilosec 20 mg a day.  6. Ambien CR 12.5 mg a day.  7. Aspirin 81 mg a day.   SOCIAL HISTORY:  He lives in [place] with his wife.  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  He denies  any history of alcohol, tobacco, or drug abuse.   FAMILY HISTORY:  His mother died at age 60 and his father died at age 52  and both of them had coronary artery disease, but not until less than 5  years before they died.  He has no siblings with coronary artery  disease.   REVIEW OF SYSTEMS:  He states he has lost 30 pounds because he has had  poor p.o. intake secondary to his GI issues.  His dentition is poor.  He  has chronic dyspnea on exertion and got some shortness of breath with  chest pain, but does not normally cough or for wheeze.  He has had no  palpitations, syncope, or presyncope.  He does have some issues with  anxiety.  He has chronic pain and arthralgias, especially in his knees  and he has been told he needs a right knee replacement which is  scheduled.  He has had some nausea with this recent episode of chest   pain and the reflux symptoms are well documented.  A full 14-point  review of systems is otherwise negative.   PHYSICAL EXAMINATION:  VITAL SIGNS:  Temperature is 98.3, blood pressure  125/73, pulse 86, and respiratory rate 16.  GENERAL:  He is a well-developed, obese white male in no acute distress.  HEENT:  Normal.  NECK:  There is no lymphadenopathy, thyromegaly, bruit, or JVD noted.  CV:  His heart is regular rate and rhythm with an S1 and S2 and no  significant murmur, rub, or gallop is noted.  Distal pulses are 2+ in  all 4 extremities and no femoral bruits are appreciated.  LUNGS:  Essentially clear to auscultation bilaterally and there is some  mild upper chest wall tenderness.  Skin:  No rashes or lesions are noted.  ABDOMEN:  Soft and nontender with active bowel sounds.  EXTREMITIES:  There is no cyanosis, clubbing, or edema noted.  MUSCULOSKELETAL:  There is no joint deformity or effusions and no spine  or CVA tenderness.  NEURO:  He is alert and oriented with cranial nerves II-XII grossly  intact.   Chest x-ray, no acute disease.   Cardiac CT shows skipped segments in the LAD and RCA.  Circumflex is  poorly opacified.   EKG is sinus bradycardia, rate 59 with no acute ischemic changes.   LABORATORY VALUES:  Point-of-care markers yesterday were negative x3.  Hemoglobin 14.1, hematocrit 41.4, WBCs 12.4, and platelets 354.  Sodium  136, potassium 4.1, chloride 102, BUN 12, creatinine 1.0, and glucose  118.   IMPRESSION:  [name] was seen today by Dr. Graciela Husbands.  His chest pain is  atypical and he has had continuous symptoms for greater than 24 hours by  his report as well as daily symptoms without EKG changes or enzyme  elevations.  Cardiac CT was not nondiagnostic.  He has multiple cardiac  risk factors and Dr. Graciela Husbands discussed cardiac catheterization with him as  a way to reach a definitive diagnosis.  [name] prefers this and he  and his wife understand the risks  and benefits  of the procedure and  agree to proceed.  He will be, otherwise, continued on his home  medications.  He states he has not been compliant with CPAP recently and  has been sleeping very poorly, Ambien CR taken as directed has not been  much help.  We will try Restoril and have him follow up with his family  physician.  Of note, he has been bradycardic in the emergency room with  a heart rate in the low 50s, but he had Lopressor 50 mg p.o. this a.m.  as well as 2.5 mg IV for the cardiac CT.  He was not on beta-blocker  prior to admission and we will not start one unless his cardiac  catheterization is positive for CAD.      Theodore Demark, PA-C      Duke Salvia, MD, [facility]  Electronically Signed    RB/MEDQ  D:  03/30/2009  T:  03/31/2009  Job:  [id number]

## 2011-02-20 NOTE — Op Note (Signed)
NAME:  [name], [name] NO.:  [id number]   MEDICAL RECORD NO.:  [id number]          PATIENT TYPE:  AMB   LOCATION:  DAY                           FACILITY:  [facility]   PHYSICIAN:  Dalia Heading, M.D.  DATE OF BIRTH:  November 06, 1950   DATE OF PROCEDURE:  09/26/2007  DATE OF DISCHARGE:                               OPERATIVE REPORT   PREOPERATIVE DIAGNOSIS:  Condyloma, perineum.   POSTOPERATIVE DIAGNOSIS:  Condyloma, perineum.   PROCEDURE:  Excision of condyloma, perineum.   SURGEON:  Dr. Franky Macho.   ANESTHESIA:  Spinal.   INDICATIONS:  The patient is a 60 year old white male who presents with  condyloma and skin tags along the perineum and the base of the scrotum.  Risks and benefits of the procedure including recurrence of the skin  lesions were fully explained to the patient, who gave informed consent.   PROCEDURE NOTE:  The patient was placed in the frog-leg supine position  after spinal anesthesia was administered.  The perineum was prepped and  draped using the usual sterile technique with Betadine.  Surgical site  confirmation was performed.   Greater than 30 skin tags/condyloma were found at the base the scrotal  and inner thighs.  All were excised and fulgurated using Bovie  electrocautery.  Sensorcaine 0.5% was instilled into the surrounding  areas that were affected.  Neosporin ointment was applied.  The  specimens were sent to pathology for further examination.  The patient  tolerated procedure well.   The patient was transferred back to PACU in stable condition.   COMPLICATIONS:  None.   SPECIMEN:  Condyloma, perineum.   BLOOD LOSS:  None.      Dalia Heading, M.D.  Electronically Signed     MAJ/MEDQ  D:  09/26/2007  T:  09/27/2007  Job:  [id number]   cc:   Patrica Duel, M.D.  Fax: [phone]   Dalia Heading, M.D.  Fax: [phone]

## 2011-02-23 NOTE — Cardiovascular Report (Signed)
NAME:  [name], [name] NO.:  [id number]   MEDICAL RECORD NO.:  [id number]          PATIENT TYPE:  OIB   LOCATION:  6501                         FACILITY:  [facility]   PHYSICIAN:  Rollene Rotunda, M.D.   DATE OF BIRTH:  [birth year]   DATE OF PROCEDURE:  11/10/2004  DATE OF DISCHARGE:  11/10/2004                              CARDIAC CATHETERIZATION   PRIMARY CARE PHYSICIAN:  Patrica Duel, MD   REASON FOR PRESENTATION:  Patient with chest pain.   PROCEDURE:  Left heart catheterization performed via the right femoral  artery.  The artery was cannulated using _________ puncture.  A #4 French  arterial sheath was inserted via the modified Seldinger technique.  Preformed Judkins and pigtail catheters were utilized.  The patient  tolerated the procedure well and left the lab in stable condition.   RESULTS:  Hemodynamics:  LV 112/5, AO 109/87.  Coronaries:  Left main was  normal.  The LAD was normal.  There was a large first diagonal which was  normal.  There was a small second diagonal which was normal.  The circumflex  and the AV groove essentially terminated as two obtuse marginals.  The  proximal segment in the AV groove was normal.  OM1 was large and normal.  OM2 with moderate size proximal 25% stenosis.  The right coronary artery was  a large dominant vessel and was normal throughout its course.  There was an  acute marginal which was normal.  The PDA was large and normal.  Left  ventriculogram:  The left ventriculogram was obtained in the RAO projection.  The EF was 65%, normal.   CONCLUSION:  Normal coronaries.  Normal left ventricular function.   PLAN:  No further cardiac workup is suggested.  The patient will continue  with primary risk reduction.      JH/MEDQ  D:  11/10/2004  T:  11/10/2004  Job:  [id number]   cc:   Patrica Duel, M.D.  [address]  [place]  [place] [address]  Fax: [phone]

## 2011-02-23 NOTE — H&P (Signed)
   NAME:  [name], [name] NO.:  [id number]   MEDICAL RECORD NO.:  [id number]                   PATIENT TYPE:  OUT   LOCATION:  RAD                                  FACILITY:  [facility]   PHYSICIAN:  Dalia Heading, M.D.               DATE OF BIRTH:  1951/04/05   DATE OF ADMISSION:  04/30/2003  DATE OF DISCHARGE:  04/30/2003                                HISTORY & PHYSICAL   CHIEF COMPLAINT:  Epigastric pain.   HISTORY OF PRESENT ILLNESS:  The patient is a 60 year old white male who is  referred for evaluation and treatment of upper abdominal pain.  He was seen  in the emergency room recently having right upper quadrant discomfort,  nausea, fatty food intolerance, indigestion, and bloating.  No emesis was  noted.  He has taken Protonix which has been somewhat helpful.  No fever,  chills, jaundice had been noted.  Ultrasound was negative for  cholelithiasis.   PAST MEDICAL HISTORY:  Includes hypertension.   PAST SURGICAL HISTORY:  Kidney removal, colonoscopy in 2003.   CURRENT MEDICATIONS:  Lopid, Altace, __________, Niaspan, Protonix.   ALLERGIES:  AUGMENTIN.   REVIEW OF SYSTEMS:  The patient denies drinking or smoking.  Denies any  other cardiopulmonary difficulties or bleeding disorders.   PHYSICAL EXAMINATION:  GENERAL:  The patient is a well-developed, well-  nourished white male in no acute distress.  VITAL SIGNS:  He is afebrile and vital signs are stable.  HEENT:  No scleral icterus.  LUNGS:  Clear to auscultation with equal breath sounds bilaterally.  HEART:  Regular rate and rhythm without S3, S4, or murmurs.  ABDOMEN:  Soft and nondistended.  He is slightly tender in the right upper  quadrant to palpation.  No hepatosplenomegaly, masses, or hernias are  identified.   IMPRESSION:  Epigastric pain, question peptic ulcer disease.    PLAN:  The patient is scheduled for EGD on May 06, 2003.  The risks and  benefits of the procedure  including bleeding and perforation were fully  explained to the patient, gave informed consent.                                               Dalia Heading, M.D.    MAJ/MEDQ  D:  05/03/2003  T:  05/03/2003  Job:  [id number]   cc:   Patrica Duel, M.D.  [address]  [place]  [place] [address]  Fax: [phone]

## 2011-02-23 NOTE — Procedures (Signed)
   NAME:  TREA, LATNER NO.:  [id number]   MEDICAL RECORD NO.:  [id number]                   PATIENT TYPE:  OUT   LOCATION:  RAD                                  FACILITY:  [facility]   PHYSICIAN:  Thomas C. Wall, M.D. [facility]            DATE OF BIRTH:  [birth year]   DATE OF PROCEDURE:  06/16/2002  DATE OF DISCHARGE:                                    STRESS TEST   Baseline EKG:  Normal sinus rhythm.  Small inferior Q wave.  The patient has  a history of a negative stress test back in July 2000.  He has hypertension,  hyperlipidemia, family history of coronary artery disease, and borderline  diabetes.  The patient wants to start strenuous exercise and, because of his  multiple cardiac risk factors, was referred for stress testing.  The patient  exercised 10 minutes 32 seconds with the Bruce protocol obtaining a heart  rate of 159.  Target heart rate was 144.  Test was stopped due to fatigue.  He had one PVC.  He had no chest pain or EKG changes.  Cardiolite images are  to follow.     Cloretta Ned C. Daleen Squibb, M.D. [facility]    ML/MEDQ  D:  06/16/2002  T:  06/17/2002  Job:  [id number]

## 2011-07-13 LAB — CBC
MCHC: 33.6
MCV: 90.2
RDW: 13.4

## 2011-07-13 LAB — BASIC METABOLIC PANEL
BUN: 16
CO2: 29
Calcium: 9.2
Chloride: 101
Creatinine, Ser: 1
Glucose, Bld: 112 — ABNORMAL HIGH

## 2011-07-19 ENCOUNTER — Other Ambulatory Visit (HOSPITAL_COMMUNITY): Payer: Self-pay | Admitting: Family Medicine

## 2011-07-19 ENCOUNTER — Ambulatory Visit (HOSPITAL_COMMUNITY)
Admission: RE | Admit: 2011-07-19 | Discharge: 2011-07-19 | Disposition: A | Discharge status: Home / Self Care | Payer: BC Managed Care – PPO | Source: Ambulatory Visit | Attending: Family Medicine | Admitting: Family Medicine

## 2011-07-19 DIAGNOSIS — M169 Osteoarthritis of hip, unspecified: Secondary | ICD-10-CM

## 2011-07-19 DIAGNOSIS — M25559 Pain in unspecified hip: Secondary | ICD-10-CM

## 2011-09-04 ENCOUNTER — Ambulatory Visit: Payer: BC Managed Care – PPO | Admitting: Orthopedic Surgery

## 2011-09-06 ENCOUNTER — Other Ambulatory Visit: Payer: Self-pay | Admitting: Oncology

## 2011-09-22 ENCOUNTER — Other Ambulatory Visit: Payer: Self-pay | Admitting: Oncology

## 2011-12-09 ENCOUNTER — Other Ambulatory Visit: Payer: Self-pay | Admitting: General Surgery

## 2011-12-13 ENCOUNTER — Other Ambulatory Visit: Payer: Self-pay | Admitting: General Surgery

## 2012-01-15 ENCOUNTER — Other Ambulatory Visit (HOSPITAL_COMMUNITY): Payer: Self-pay | Admitting: Internal Medicine

## 2012-01-15 DIAGNOSIS — E785 Hyperlipidemia, unspecified: Secondary | ICD-10-CM

## 2012-01-15 DIAGNOSIS — M064 Inflammatory polyarthropathy: Secondary | ICD-10-CM

## 2012-01-15 DIAGNOSIS — E119 Type 2 diabetes mellitus without complications: Secondary | ICD-10-CM

## 2012-01-15 DIAGNOSIS — R945 Abnormal results of liver function studies: Secondary | ICD-10-CM

## 2012-01-18 ENCOUNTER — Other Ambulatory Visit (HOSPITAL_COMMUNITY): Payer: BC Managed Care – PPO

## 2012-10-31 ENCOUNTER — Other Ambulatory Visit (HOSPITAL_COMMUNITY): Payer: Self-pay | Admitting: Internal Medicine

## 2012-10-31 DIAGNOSIS — R945 Abnormal results of liver function studies: Secondary | ICD-10-CM

## 2012-11-07 ENCOUNTER — Ambulatory Visit (HOSPITAL_COMMUNITY): Payer: BC Managed Care – PPO | Attending: Internal Medicine

## 2012-12-09 ENCOUNTER — Other Ambulatory Visit: Payer: Self-pay | Admitting: Physical Medicine and Rehabilitation

## 2012-12-09 DIAGNOSIS — M545 Low back pain: Secondary | ICD-10-CM

## 2012-12-18 ENCOUNTER — Ambulatory Visit
Admission: RE | Admit: 2012-12-18 | Discharge: 2012-12-18 | Disposition: A | Discharge status: Home / Self Care | Payer: BC Managed Care – PPO | Source: Ambulatory Visit | Attending: Physical Medicine and Rehabilitation | Admitting: Physical Medicine and Rehabilitation

## 2013-03-11 ENCOUNTER — Emergency Department (HOSPITAL_COMMUNITY)
Admission: EM | Admit: 2013-03-11 | Discharge: 2013-03-12 | Disposition: A | Discharge status: Home / Self Care | Payer: BC Managed Care – PPO | Attending: Emergency Medicine | Admitting: Emergency Medicine

## 2013-03-11 ENCOUNTER — Emergency Department (HOSPITAL_COMMUNITY): Payer: BC Managed Care – PPO

## 2013-03-11 ENCOUNTER — Encounter (HOSPITAL_COMMUNITY): Payer: Self-pay | Admitting: Emergency Medicine

## 2013-03-11 DIAGNOSIS — S0510XA Contusion of eyeball and orbital tissues, unspecified eye, initial encounter: Secondary | ICD-10-CM | POA: Insufficient documentation

## 2013-03-11 DIAGNOSIS — Z8659 Personal history of other mental and behavioral disorders: Secondary | ICD-10-CM | POA: Insufficient documentation

## 2013-03-11 DIAGNOSIS — F1120 Opioid dependence, uncomplicated: Secondary | ICD-10-CM | POA: Insufficient documentation

## 2013-03-11 DIAGNOSIS — S20219A Contusion of unspecified front wall of thorax, initial encounter: Secondary | ICD-10-CM | POA: Insufficient documentation

## 2013-03-11 DIAGNOSIS — E119 Type 2 diabetes mellitus without complications: Secondary | ICD-10-CM | POA: Insufficient documentation

## 2013-03-11 DIAGNOSIS — I10 Essential (primary) hypertension: Secondary | ICD-10-CM | POA: Insufficient documentation

## 2013-03-11 DIAGNOSIS — Z862 Personal history of diseases of the blood and blood-forming organs and certain disorders involving the immune mechanism: Secondary | ICD-10-CM | POA: Insufficient documentation

## 2013-03-11 DIAGNOSIS — Y9389 Activity, other specified: Secondary | ICD-10-CM | POA: Insufficient documentation

## 2013-03-11 DIAGNOSIS — Z8739 Personal history of other diseases of the musculoskeletal system and connective tissue: Secondary | ICD-10-CM | POA: Insufficient documentation

## 2013-03-11 DIAGNOSIS — F1190 Opioid use, unspecified, uncomplicated: Secondary | ICD-10-CM

## 2013-03-11 DIAGNOSIS — Z87891 Personal history of nicotine dependence: Secondary | ICD-10-CM | POA: Insufficient documentation

## 2013-03-11 DIAGNOSIS — Y929 Unspecified place or not applicable: Secondary | ICD-10-CM | POA: Insufficient documentation

## 2013-03-11 DIAGNOSIS — S20211A Contusion of right front wall of thorax, initial encounter: Secondary | ICD-10-CM

## 2013-03-11 DIAGNOSIS — Z8639 Personal history of other endocrine, nutritional and metabolic disease: Secondary | ICD-10-CM | POA: Insufficient documentation

## 2013-03-11 DIAGNOSIS — G8929 Other chronic pain: Secondary | ICD-10-CM

## 2013-03-11 DIAGNOSIS — S0012XA Contusion of left eyelid and periocular area, initial encounter: Secondary | ICD-10-CM

## 2013-03-11 HISTORY — DX: Type 2 diabetes mellitus without complications: E11.9

## 2013-03-11 HISTORY — DX: Anxiety disorder, unspecified: F41.9

## 2013-03-11 HISTORY — DX: Pure hyperglyceridemia: E78.1

## 2013-03-11 HISTORY — DX: Pure hypercholesterolemia, unspecified: E78.00

## 2013-03-11 HISTORY — DX: Major depressive disorder, single episode, unspecified: F32.9

## 2013-03-11 HISTORY — DX: Depression, unspecified: F32.A

## 2013-03-11 HISTORY — DX: Essential (primary) hypertension: I10

## 2013-03-11 LAB — BASIC METABOLIC PANEL
CO2: 25 mEq/L (ref 19–32)
Chloride: 96 mEq/L (ref 96–112)
Sodium: 135 mEq/L (ref 135–145)

## 2013-03-11 LAB — CBC WITH DIFFERENTIAL/PLATELET
Eosinophils Relative: 3 % (ref 0–5)
HCT: 40.4 % (ref 39.0–52.0)
Hemoglobin: 13.8 g/dL (ref 13.0–17.0)
Lymphs Abs: 4.5 10*3/uL — ABNORMAL HIGH (ref 0.7–4.0)
MCV: 94.4 fL (ref 78.0–100.0)
Monocytes Relative: 7 % (ref 3–12)
Neutro Abs: 11.6 10*3/uL — ABNORMAL HIGH (ref 1.7–7.7)
RBC: 4.28 MIL/uL (ref 4.22–5.81)
WBC: 17.9 10*3/uL — ABNORMAL HIGH (ref 4.0–10.5)

## 2013-03-11 LAB — RAPID URINE DRUG SCREEN, HOSP PERFORMED
Amphetamines: NOT DETECTED
Benzodiazepines: POSITIVE — AB
Opiates: POSITIVE — AB

## 2013-03-11 LAB — ETHANOL: Alcohol, Ethyl (B): <11 mg/dL (ref 0–11)

## 2013-03-11 NOTE — ED Notes (Signed)
Per Dr. Adriana Simas, patient doesn't have to have a sitter.
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
## 2013-03-12 NOTE — ED Notes (Signed)
Telepsych evaluation being performed at this time.

## 2013-03-12 NOTE — ED Notes (Signed)
Work note given to patient excusing him from work until Monday as instructed by Dr. Rulon Abide.

## 2013-03-12 NOTE — ED Notes (Signed)
Telepsych psychiatrist recommend for IVC paperwork to be reversed and for patient to be discharged home. Patient and wife verbalized understanding. Informed them to wait for discharge paperwork.

## 2013-03-12 NOTE — ED Notes (Signed)
[facility] deputy brought IVC paperwork that patient's wife took out on patient at 1555 p.m. Deputy served papers to patient at 2115. Patient cooperative and verbalizes understanding.

## 2013-03-12 NOTE — ED Notes (Signed)
Sitter discontinued. Per Dr. Adriana Simas, patient does not need a sitter at this time. Patient calm and cooperative.

## 2013-03-12 NOTE — ED Notes (Signed)
Trained sitter sitting with patient at this time.

## 2013-05-14 ENCOUNTER — Emergency Department (HOSPITAL_COMMUNITY): Payer: BC Managed Care – PPO

## 2013-05-14 ENCOUNTER — Observation Stay (HOSPITAL_COMMUNITY)
Admission: EM | Admit: 2013-05-14 | Discharge: 2013-05-15 | Disposition: A | Discharge status: Home / Self Care | Payer: BC Managed Care – PPO | Attending: Family Medicine | Admitting: Family Medicine

## 2013-05-14 ENCOUNTER — Encounter (HOSPITAL_COMMUNITY): Payer: Self-pay | Admitting: Emergency Medicine

## 2013-05-14 DIAGNOSIS — M549 Dorsalgia, unspecified: Secondary | ICD-10-CM | POA: Insufficient documentation

## 2013-05-14 DIAGNOSIS — F1910 Other psychoactive substance abuse, uncomplicated: Secondary | ICD-10-CM | POA: Diagnosis present

## 2013-05-14 DIAGNOSIS — D72829 Elevated white blood cell count, unspecified: Secondary | ICD-10-CM | POA: Diagnosis present

## 2013-05-14 DIAGNOSIS — F32A Depression, unspecified: Secondary | ICD-10-CM

## 2013-05-14 DIAGNOSIS — E119 Type 2 diabetes mellitus without complications: Secondary | ICD-10-CM | POA: Insufficient documentation

## 2013-05-14 DIAGNOSIS — E785 Hyperlipidemia, unspecified: Secondary | ICD-10-CM | POA: Diagnosis present

## 2013-05-14 DIAGNOSIS — R4781 Slurred speech: Secondary | ICD-10-CM

## 2013-05-14 DIAGNOSIS — K219 Gastro-esophageal reflux disease without esophagitis: Secondary | ICD-10-CM | POA: Insufficient documentation

## 2013-05-14 DIAGNOSIS — F3289 Other specified depressive episodes: Principal | ICD-10-CM

## 2013-05-14 DIAGNOSIS — R4789 Other speech disturbances: Secondary | ICD-10-CM

## 2013-05-14 DIAGNOSIS — F329 Major depressive disorder, single episode, unspecified: Secondary | ICD-10-CM

## 2013-05-14 DIAGNOSIS — I10 Essential (primary) hypertension: Secondary | ICD-10-CM

## 2013-05-14 DIAGNOSIS — G8929 Other chronic pain: Secondary | ICD-10-CM | POA: Insufficient documentation

## 2013-05-14 LAB — CBC WITH DIFFERENTIAL/PLATELET
Basophils Absolute: 0.1 K/uL (ref 0.0–0.1)
Basophils Relative: 1 % (ref 0–1)
Eosinophils Absolute: 0.2 K/uL (ref 0.0–0.7)
Eosinophils Relative: 2 % (ref 0–5)
HCT: 35.8 % — ABNORMAL LOW (ref 39.0–52.0)
Hemoglobin: 11.8 g/dL — ABNORMAL LOW (ref 13.0–17.0)
Lymphocytes Relative: 22 % (ref 12–46)
Lymphs Abs: 2.5 K/uL (ref 0.7–4.0)
MCH: 32 pg (ref 26.0–34.0)
MCHC: 33 g/dL (ref 30.0–36.0)
MCV: 97 fL (ref 78.0–100.0)
Monocytes Absolute: 0.9 K/uL (ref 0.1–1.0)
Monocytes Relative: 8 % (ref 3–12)
Neutro Abs: 7.6 K/uL (ref 1.7–7.7)
Neutrophils Relative %: 68 % (ref 43–77)
Platelets: 171 K/uL (ref 150–400)
RBC: 3.69 MIL/uL — ABNORMAL LOW (ref 4.22–5.81)
RDW: 13.3 % (ref 11.5–15.5)
WBC: 11.2 K/uL — ABNORMAL HIGH (ref 4.0–10.5)

## 2013-05-14 LAB — RAPID URINE DRUG SCREEN, HOSP PERFORMED
Cocaine: NOT DETECTED
Opiates: NOT DETECTED

## 2013-05-14 LAB — ETHANOL: Alcohol, Ethyl (B): <11 mg/dL (ref 0–11)

## 2013-05-14 LAB — COMPREHENSIVE METABOLIC PANEL
ALT: 44 U/L (ref 0–53)
BUN: 22 mg/dL (ref 6–23)
Calcium: 10.6 mg/dL — ABNORMAL HIGH (ref 8.4–10.5)
Creatinine, Ser: 1.1 mg/dL (ref 0.50–1.35)
GFR calc Af Amer: 82 mL/min — ABNORMAL LOW (ref >90)
Glucose, Bld: 122 mg/dL — ABNORMAL HIGH (ref 70–99)
Sodium: 137 mEq/L (ref 135–145)
Total Protein: 6.4 g/dL (ref 6.0–8.3)

## 2013-05-14 LAB — GLUCOSE, CAPILLARY

## 2013-05-14 LAB — URINALYSIS, ROUTINE W REFLEX MICROSCOPIC
Nitrite: NEGATIVE
Protein, ur: NEGATIVE mg/dL
Specific Gravity, Urine: >1.03 — ABNORMAL HIGH (ref 1.005–1.030)
Urobilinogen, UA: 0.2 mg/dL (ref 0.0–1.0)

## 2013-05-14 MED ORDER — SODIUM CHLORIDE 0.9 % IV SOLN
INTRAVENOUS | Status: DC
  Administered 2013-05-14: 19:00:00 via INTRAVENOUS

## 2013-05-14 MED ORDER — ONDANSETRON HCL 4 MG PO TABS: 4.0000 mg | ORAL_TABLET | Freq: Four times a day (QID) | ORAL | Status: DC | PRN

## 2013-05-14 MED ORDER — GADOBENATE DIMEGLUMINE 529 MG/ML IV SOLN
20.0000 mL | Freq: Once | INTRAVENOUS | Status: AC | PRN
  Administered 2013-05-14: 20 mL via INTRAVENOUS

## 2013-05-14 MED ORDER — SODIUM CHLORIDE 0.9 % IJ SOLN: 3.0000 mL | Freq: Two times a day (BID) | INTRAMUSCULAR | Status: DC

## 2013-05-14 MED ORDER — FENOFIBRATE 54 MG PO TABS
54.0000 mg | ORAL_TABLET | Freq: Every day | ORAL | Status: DC
  Administered 2013-05-14 – 2013-05-15 (×2): 54 mg via ORAL
  Filled 2013-05-14 (×4): qty 1

## 2013-05-14 MED ORDER — IBUPROFEN 800 MG PO TABS
800.0000 mg | ORAL_TABLET | Freq: Four times a day (QID) | ORAL | Status: DC | PRN
  Administered 2013-05-14: 800 mg via ORAL
  Filled 2013-05-14: qty 1

## 2013-05-14 MED ORDER — FENOFIBRATE 54 MG PO TABS
54.0000 mg | ORAL_TABLET | Freq: Every day | ORAL | Status: DC
  Filled 2013-05-14 (×3): qty 1

## 2013-05-14 MED ORDER — ONDANSETRON HCL 4 MG/2ML IJ SOLN: 4.0000 mg | Freq: Four times a day (QID) | INTRAMUSCULAR | Status: DC | PRN

## 2013-05-14 MED ORDER — FLUTICASONE PROPIONATE 50 MCG/ACT NA SUSP
2.0000 | Freq: Every day | NASAL | Status: DC
  Filled 2013-05-14: qty 16

## 2013-05-14 MED ORDER — LORAZEPAM 2 MG/ML IJ SOLN
0.5000 mg | Freq: Once | INTRAMUSCULAR | Status: AC
  Administered 2013-05-14: 0.5 mg via INTRAVENOUS
  Filled 2013-05-14: qty 1

## 2013-05-14 MED ORDER — ACETAMINOPHEN 325 MG PO TABS: 650.0000 mg | ORAL_TABLET | Freq: Four times a day (QID) | ORAL | Status: DC | PRN

## 2013-05-14 MED ORDER — DOCUSATE SODIUM 100 MG PO CAPS
100.0000 mg | ORAL_CAPSULE | Freq: Two times a day (BID) | ORAL | Status: DC
  Administered 2013-05-15: 100 mg via ORAL
  Filled 2013-05-14 (×5): qty 1

## 2013-05-14 MED ORDER — FLEET ENEMA 7-19 GM/118ML RE ENEM: 1.0000 | ENEMA | Freq: Once | RECTAL | Status: AC | PRN

## 2013-05-14 MED ORDER — ALUM & MAG HYDROXIDE-SIMETH 200-200-20 MG/5ML PO SUSP: 30.0000 mL | Freq: Four times a day (QID) | ORAL | Status: DC | PRN

## 2013-05-14 MED ORDER — FENOFIBRATE 48 MG PO TABS
ORAL_TABLET | ORAL | Status: AC
  Filled 2013-05-14: qty 1

## 2013-05-14 MED ORDER — BISACODYL 10 MG RE SUPP: 10.0000 mg | Freq: Every day | RECTAL | Status: DC | PRN

## 2013-05-14 MED ORDER — FLUTICASONE PROPIONATE 50 MCG/ACT NA SUSP
2.0000 | Freq: Every day | NASAL | Status: DC
  Administered 2013-05-15: 2 via NASAL
  Filled 2013-05-14 (×2): qty 16

## 2013-05-14 MED ORDER — ACETAMINOPHEN 650 MG RE SUPP: 650.0000 mg | Freq: Four times a day (QID) | RECTAL | Status: DC | PRN
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
## 2013-05-14 NOTE — ED Notes (Signed)
Karen Black, NP at bedside 
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
## 2013-05-15 LAB — BASIC METABOLIC PANEL
CO2: 27 mEq/L (ref 19–32)
Chloride: 100 mEq/L (ref 96–112)
GFR calc Af Amer: >90 mL/min (ref >90)
Potassium: 3.9 mEq/L (ref 3.5–5.1)

## 2013-05-15 LAB — CBC
HCT: 34.9 % — ABNORMAL LOW (ref 39.0–52.0)
Hemoglobin: 11.4 g/dL — ABNORMAL LOW (ref 13.0–17.0)
MCV: 97.8 fL (ref 78.0–100.0)
RBC: 3.57 MIL/uL — ABNORMAL LOW (ref 4.22–5.81)
WBC: 11.4 10*3/uL — ABNORMAL HIGH (ref 4.0–10.5)

## 2013-05-15 NOTE — Progress Notes (Signed)
UR Chart Review Completed  
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
## 2013-05-15 NOTE — Discharge Summary (Signed)
NAME:  [name], [name] NO.:  [id number]  MEDICAL RECORD NO.:  [id number]  LOCATION:  A337                          FACILITY:  [facility]  PHYSICIAN:  Kashden Deboy G. Renard Matter, MD   DATE OF BIRTH:  November 13, 1950  DATE OF ADMISSION:  05/14/2013 DATE OF DISCHARGE:  08/08/2014LH                              DISCHARGE SUMMARY   ADDENDUM:  The patient will be discharged on the following medications; Advil 200 mg every 6 hours as needed for pain, multivitamin 1 daily, Flonase 50 mcg/ACT nasal spray 2 sprays into each nostril daily, TriCor 145 mg daily, Lexapro 10 mg daily, Ambien 10 mg at bedtime for sleep as needed, Pravachol 20 mg daily, Mobic 15 mg daily, Prinivil 10 mg daily, Flomax 0.4 mg 1 capsule daily, Janumet 50/500 mg daily, and Xanax 1 mg daily as needed for anxiety.  The patient's condition stable at the time of his discharge.     Emmalee Solivan G. Renard Matter, MD     AGM/MEDQ  D:  05/15/2013  T:  05/15/2013  Job:  [id number]
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
## 2013-08-13 ENCOUNTER — Other Ambulatory Visit: Payer: Self-pay

## 2013-08-28 ENCOUNTER — Ambulatory Visit (INDEPENDENT_AMBULATORY_CARE_PROVIDER_SITE_OTHER): Payer: BC Managed Care – PPO | Admitting: Family Medicine

## 2013-08-28 ENCOUNTER — Encounter: Payer: Self-pay | Admitting: Family Medicine

## 2013-08-28 VITALS — BP 159/84 | HR 102 | Temp 97.6°F | Ht 66.0 in | Wt 248.0 lb

## 2013-08-28 DIAGNOSIS — E119 Type 2 diabetes mellitus without complications: Secondary | ICD-10-CM

## 2013-08-28 DIAGNOSIS — J329 Chronic sinusitis, unspecified: Secondary | ICD-10-CM

## 2013-08-28 DIAGNOSIS — I10 Essential (primary) hypertension: Secondary | ICD-10-CM

## 2013-08-28 LAB — POCT GLYCOSYLATED HEMOGLOBIN (HGB A1C): Hemoglobin A1C: 6.6

## 2013-08-28 MED ORDER — FENOFIBRATE 145 MG PO TABS: 145.0000 mg | ORAL_TABLET | Freq: Every day | ORAL | Status: DC

## 2013-08-28 MED ORDER — PRAVASTATIN SODIUM 20 MG PO TABS: 20.0000 mg | ORAL_TABLET | Freq: Every day | ORAL | Status: DC

## 2013-08-28 MED ORDER — SITAGLIPTIN PHOS-METFORMIN HCL 50-500 MG PO TABS: 1.0000 | ORAL_TABLET | Freq: Every day | ORAL | Status: DC

## 2013-08-28 MED ORDER — TAMSULOSIN HCL 0.4 MG PO CAPS: 0.4000 mg | ORAL_CAPSULE | Freq: Every day | ORAL | Status: DC

## 2013-08-28 MED ORDER — DOXYCYCLINE HYCLATE 100 MG PO CAPS: 100.0000 mg | ORAL_CAPSULE | Freq: Two times a day (BID) | ORAL | Status: DC

## 2013-08-28 MED ORDER — PREGABALIN 50 MG PO CAPS: 50.0000 mg | ORAL_CAPSULE | Freq: Three times a day (TID) | ORAL | Status: DC

## 2013-08-28 MED ORDER — LISINOPRIL 10 MG PO TABS: 10.0000 mg | ORAL_TABLET | Freq: Every day | ORAL | Status: DC

## 2013-08-28 MED ORDER — ESCITALOPRAM OXALATE 10 MG PO TABS: 10.0000 mg | ORAL_TABLET | Freq: Every day | ORAL | Status: DC
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
## 2013-08-29 LAB — NMR, LIPOPROFILE
HDL Cholesterol by NMR: 35 mg/dL — ABNORMAL LOW (ref >=40)
HDL Particle Number: 30.5 umol/L (ref >=30.5)
LDL Particle Number: 2198 nmol/L — ABNORMAL HIGH (ref <1000)
LDLC SERPL CALC-MCNC: 75 mg/dL (ref <100)
LP-IR Score: 87 — ABNORMAL HIGH (ref <=45)
Small LDL Particle Number: 1651 nmol/L — ABNORMAL HIGH (ref <=527)
Triglycerides by NMR: 349 mg/dL — ABNORMAL HIGH (ref <150)

## 2013-08-29 LAB — COMPREHENSIVE METABOLIC PANEL
ALT: 35 IU/L (ref 0–44)
Albumin/Globulin Ratio: 2.3 (ref 1.1–2.5)
Albumin: 4.2 g/dL (ref 3.6–4.8)
Alkaline Phosphatase: 40 IU/L (ref 39–117)
BUN/Creatinine Ratio: 16 (ref 10–22)
Chloride: 98 mmol/L (ref 97–108)
Creatinine, Ser: 0.81 mg/dL (ref 0.76–1.27)
GFR calc Af Amer: 111 mL/min/{1.73_m2} (ref >59)
Globulin, Total: 1.8 g/dL (ref 1.5–4.5)
Glucose: 213 mg/dL — ABNORMAL HIGH (ref 65–99)
Potassium: 4.6 mmol/L (ref 3.5–5.2)
Sodium: 139 mmol/L (ref 134–144)
Total Bilirubin: <0.2 mg/dL (ref 0.0–1.2)
Total Protein: 6 g/dL (ref 6.0–8.5)

## 2013-08-29 LAB — TSH: TSH: 1.63 u[IU]/mL (ref 0.450–4.500)

## 2013-08-29 LAB — CBC
HCT: 38.8 % (ref 37.5–51.0)
MCHC: 33.2 g/dL (ref 31.5–35.7)
RDW: 13.8 % (ref 12.3–15.4)

## 2013-08-31 ENCOUNTER — Other Ambulatory Visit: Payer: Self-pay | Admitting: Family Medicine

## 2013-08-31 ENCOUNTER — Encounter: Payer: Self-pay | Admitting: *Deleted

## 2013-08-31 DIAGNOSIS — E785 Hyperlipidemia, unspecified: Secondary | ICD-10-CM

## 2013-08-31 MED ORDER — ROSUVASTATIN CALCIUM 40 MG PO TABS: 20.0000 mg | ORAL_TABLET | Freq: Every day | ORAL | Status: DC

## 2013-09-08 ENCOUNTER — Other Ambulatory Visit: Payer: Self-pay | Admitting: Family Medicine

## 2013-09-09 ENCOUNTER — Encounter: Payer: Self-pay | Admitting: Family Medicine

## 2013-09-09 ENCOUNTER — Ambulatory Visit (INDEPENDENT_AMBULATORY_CARE_PROVIDER_SITE_OTHER): Payer: BC Managed Care – PPO | Admitting: Family Medicine

## 2013-09-09 VITALS — BP 101/59 | HR 83 | Temp 98.4°F | Ht 66.0 in | Wt 263.0 lb

## 2013-09-09 DIAGNOSIS — F419 Anxiety disorder, unspecified: Secondary | ICD-10-CM

## 2013-09-09 DIAGNOSIS — G4700 Insomnia, unspecified: Secondary | ICD-10-CM

## 2013-09-09 DIAGNOSIS — E785 Hyperlipidemia, unspecified: Secondary | ICD-10-CM

## 2013-09-09 DIAGNOSIS — F411 Generalized anxiety disorder: Secondary | ICD-10-CM

## 2013-09-09 MED ORDER — CLONAZEPAM 0.5 MG PO TABS: 0.5000 mg | ORAL_TABLET | Freq: Two times a day (BID) | ORAL | Status: DC | PRN

## 2013-09-09 MED ORDER — TRAZODONE HCL 50 MG PO TABS: 25.0000 mg | ORAL_TABLET | Freq: Every evening | ORAL | Status: DC | PRN

## 2013-09-09 NOTE — Patient Instructions (Signed)
Hypertriglyceridemia  Diet for High blood levels of Triglycerides Most fats in food are triglycerides. Triglycerides in your blood are stored as fat in your body. High levels of triglycerides in your blood may put you at a greater risk for heart disease and stroke.  Normal triglyceride levels are less than 150 mg/dL. Borderline high levels are 150-199 mg/dl. High levels are 200 - 499 mg/dL, and very high triglyceride levels are greater than 500 mg/dL. The decision to treat high triglycerides is generally based on the level. For people with borderline or high triglyceride levels, treatment includes weight loss and exercise. Drugs are recommended for people with very high triglyceride levels. Many people who need treatment for high triglyceride levels have metabolic syndrome. This syndrome is a collection of disorders that often include: insulin resistance, high blood pressure, blood clotting problems, high cholesterol and triglycerides. TESTING PROCEDURE FOR TRIGLYCERIDES  You should not eat 4 hours before getting your triglycerides measured. The normal range of triglycerides is between 10 and 250 milligrams per deciliter (mg/dl). Some people may have extreme levels (1000 or above), but your triglyceride level may be too high if it is above 150 mg/dl, depending on what other risk factors you have for heart disease.  People with high blood triglycerides may also have high blood cholesterol levels. If you have high blood cholesterol as well as high blood triglycerides, your risk for heart disease is probably greater than if you only had high triglycerides. High blood cholesterol is one of the main risk factors for heart disease. CHANGING YOUR DIET  Your weight can affect your blood triglyceride level. If you are more than 20% above your ideal body weight, you may be able to lower your blood triglycerides by losing weight. Eating less and exercising regularly is the best way to combat this. Fat provides more  calories than any other food. The best way to lose weight is to eat less fat. Only 30% of your total calories should come from fat. Less than 7% of your diet should come from saturated fat. A diet low in fat and saturated fat is the same as a diet to decrease blood cholesterol. By eating a diet lower in fat, you may lose weight, lower your blood cholesterol, and lower your blood triglyceride level.  Eating a diet low in fat, especially saturated fat, may also help you lower your blood triglyceride level. Ask your dietitian to help you figure how much fat you can eat based on the number of calories your caregiver has prescribed for you.  Exercise, in addition to helping with weight loss may also help lower triglyceride levels.   Alcohol can increase blood triglycerides. You may need to stop drinking alcoholic beverages.  Too much carbohydrate in your diet may also increase your blood triglycerides. Some complex carbohydrates are necessary in your diet. These may include bread, rice, potatoes, other starchy vegetables and cereals.  Reduce "simple" carbohydrates. These may include pure sugars, candy, honey, and jelly without losing other nutrients. If you have the kind of high blood triglycerides that is affected by the amount of carbohydrates in your diet, you will need to eat less sugar and less high-sugar foods. Your caregiver can help you with this.  Adding 2-4 grams of fish oil (EPA+ DHA) may also help lower triglycerides. Speak with your caregiver before adding any supplements to your regimen. Following the Diet  Maintain your ideal weight. Your caregivers can help you with a diet. Generally, eating less food and getting more   exercise will help you lose weight. Joining a weight control group may also help. Ask your caregivers for a good weight control group in your area.  Eat low-fat foods instead of high-fat foods. This can help you lose weight too.  These foods are lower in fat. Eat MORE of these:    Dried beans, peas, and lentils.  Egg whites.  Low-fat cottage cheese.  Fish.  Lean cuts of meat, such as round, sirloin, rump, and flank (cut extra fat off meat you fix).  Whole grain breads, cereals and pasta.  Skim and nonfat dry milk.  Low-fat yogurt.  Poultry without the skin.  Cheese made with skim or part-skim milk, such as mozzarella, parmesan, farmers', ricotta, or pot cheese. These are higher fat foods. Eat LESS of these:   Whole milk and foods made from whole milk, such as American, blue, cheddar, monterey jack, and swiss cheese  High-fat meats, such as luncheon meats, sausages, knockwurst, bratwurst, hot dogs, ribs, corned beef, ground pork, and regular ground beef.  Fried foods. Limit saturated fats in your diet. Substituting unsaturated fat for saturated fat may decrease your blood triglyceride level. You will need to read package labels to know which products contain saturated fats.  These foods are high in saturated fat. Eat LESS of these:   Fried pork skins.  Whole milk.  Skin and fat from poultry.  Palm oil.  Butter.  Shortening.  Cream cheese.  Bacon.  Margarines and baked goods made from listed oils.  Vegetable shortenings.  Chitterlings.  Fat from meats.  Coconut oil.  Palm kernel oil.  Lard.  Cream.  Sour cream.  Fatback.  Coffee whiteners and non-dairy creamers made with these oils.  Cheese made from whole milk. Use unsaturated fats (both polyunsaturated and monounsaturated) moderately. Remember, even though unsaturated fats are better than saturated fats; you still want a diet low in total fat.  These foods are high in unsaturated fat:   Canola oil.  Sunflower oil.  Mayonnaise.  Almonds.  Peanuts.  Pine nuts.  Margarines made with these oils.  Safflower oil.  Olive oil.  Avocados.  Cashews.  Peanut butter.  Sunflower seeds.  Soybean oil.  Peanut  oil.  Olives.  Pecans.  Walnuts.  Pumpkin seeds. Avoid sugar and other high-sugar foods. This will decrease carbohydrates without decreasing other nutrients. Sugar in your food goes rapidly to your blood. When there is excess sugar in your blood, your liver may use it to make more triglycerides. Sugar also contains calories without other important nutrients.  Eat LESS of these:   Sugar, brown sugar, powdered sugar, jam, jelly, preserves, honey, syrup, molasses, pies, candy, cakes, cookies, frosting, pastries, colas, soft drinks, punches, fruit drinks, and regular gelatin.  Avoid alcohol. Alcohol, even more than sugar, may increase blood triglycerides. In addition, alcohol is high in calories and low in nutrients. Ask for sparkling water, or a diet soft drink instead of an alcoholic beverage. Suggestions for planning and preparing meals   Bake, broil, grill or roast meats instead of frying.  Remove fat from meats and skin from poultry before cooking.  Add spices, herbs, lemon juice or vinegar to vegetables instead of salt, rich sauces or gravies.  Use a non-stick skillet without fat or use no-stick sprays.  Cool and refrigerate stews and broth. Then remove the hardened fat floating on the surface before serving.  Refrigerate meat drippings and skim off fat to make low-fat gravies.  Serve more fish.  Use less butter,   margarine and other high-fat spreads on bread or vegetables.  Use skim or reconstituted non-fat dry milk for cooking.  Cook with low-fat cheeses.  Substitute low-fat yogurt or cottage cheese for all or part of the sour cream in recipes for sauces, dips or congealed salads.  Use half yogurt/half mayonnaise in salad recipes.  Substitute evaporated skim milk for cream. Evaporated skim milk or reconstituted non-fat dry milk can be whipped and substituted for whipped cream in certain recipes.  Choose fresh fruits for dessert instead of high-fat foods such as pies or  cakes. Fruits are naturally low in fat. When Dining Out   Order low-fat appetizers such as fruit or vegetable juice, pasta with vegetables or tomato sauce.  Select clear, rather than cream soups.  Ask that dressings and gravies be served on the side. Then use less of them.  Order foods that are baked, broiled, poached, steamed, stir-fried, or roasted.  Ask for margarine instead of butter, and use only a small amount.  Drink sparkling water, unsweetened tea or coffee, or diet soft drinks instead of alcohol or other sweet beverages. QUESTIONS AND ANSWERS ABOUT OTHER FATS IN THE BLOOD: SATURATED FAT, TRANS FAT, AND CHOLESTEROL What is trans fat? Trans fat is a type of fat that is formed when vegetable oil is hardened through a process called hydrogenation. This process helps makes foods more solid, gives them shape, and prolongs their shelf life. Trans fats are also called hydrogenated or partially hydrogenated oils.  What do saturated fat, trans fat, and cholesterol in foods have to do with heart disease? Saturated fat, trans fat, and cholesterol in the diet all raise the level of LDL "bad" cholesterol in the blood. The higher the LDL cholesterol, the greater the risk for coronary heart disease (CHD). Saturated fat and trans fat raise LDL similarly.  What foods contain saturated fat, trans fat, and cholesterol? High amounts of saturated fat are found in animal products, such as fatty cuts of meat, chicken skin, and full-fat dairy products like butter, whole milk, cream, and cheese, and in tropical vegetable oils such as palm, palm kernel, and coconut oil. Trans fat is found in some of the same foods as saturated fat, such as vegetable shortening, some margarines (especially hard or stick margarine), crackers, cookies, baked goods, fried foods, salad dressings, and other processed foods made with partially hydrogenated vegetable oils. Small amounts of trans fat also occur naturally in some animal  products, such as milk products, beef, and lamb. Foods high in cholesterol include liver, other organ meats, egg yolks, shrimp, and full-fat dairy products. How can I use the new food label to make heart-healthy food choices? Check the Nutrition Facts panel of the food label. Choose foods lower in saturated fat, trans fat, and cholesterol. For saturated fat and cholesterol, you can also use the Percent Daily Value (%DV): 5% DV or less is low, and 20% DV or more is high. (There is no %DV for trans fat.) Use the Nutrition Facts panel to choose foods low in saturated fat and cholesterol, and if the trans fat is not listed, read the ingredients and limit products that list shortening or hydrogenated or partially hydrogenated vegetable oil, which tend to be high in trans fat. POINTS TO REMEMBER:   Discuss your risk for heart disease with your caregivers, and take steps to reduce risk factors.  Change your diet. Choose foods that are low in saturated fat, trans fat, and cholesterol.  Add exercise to your daily routine if   it is not already being done. Participate in physical activity of moderate intensity, like brisk walking, for at least 30 minutes on most, and preferably all days of the week. No time? Break the 30 minutes into three, 10-minute segments during the day.  Stop smoking. If you do smoke, contact your caregiver to discuss ways in which they can help you quit.  Do not use street drugs.  Maintain a normal weight.  Maintain a healthy blood pressure.  Keep up with your blood work for checking the fats in your blood as directed by your caregiver. Document Released: 07/12/2004 Document Revised: 03/25/2012 Document Reviewed: 02/07/2009 [employer] Patient Information 2014 [employer].  
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
## 2013-10-20 ENCOUNTER — Ambulatory Visit: Payer: BC Managed Care – PPO | Admitting: Family Medicine

## 2013-10-20 ENCOUNTER — Ambulatory Visit (INDEPENDENT_AMBULATORY_CARE_PROVIDER_SITE_OTHER): Payer: BC Managed Care – PPO | Admitting: Family Medicine

## 2013-10-21 ENCOUNTER — Telehealth: Payer: Self-pay | Admitting: Family Medicine

## 2013-10-21 ENCOUNTER — Encounter: Payer: Self-pay | Admitting: Family Medicine

## 2013-10-21 ENCOUNTER — Other Ambulatory Visit: Payer: Self-pay | Admitting: Family Medicine

## 2013-10-21 ENCOUNTER — Ambulatory Visit (INDEPENDENT_AMBULATORY_CARE_PROVIDER_SITE_OTHER): Payer: BC Managed Care – PPO | Admitting: Family Medicine

## 2013-10-21 VITALS — BP 134/74 | HR 90 | Temp 98.4°F | Ht 66.0 in | Wt 267.0 lb

## 2013-10-21 DIAGNOSIS — M5430 Sciatica, unspecified side: Secondary | ICD-10-CM

## 2013-10-21 DIAGNOSIS — G4700 Insomnia, unspecified: Secondary | ICD-10-CM

## 2013-10-21 DIAGNOSIS — J069 Acute upper respiratory infection, unspecified: Secondary | ICD-10-CM

## 2013-10-21 DIAGNOSIS — Z01818 Encounter for other preprocedural examination: Secondary | ICD-10-CM

## 2013-10-21 MED ORDER — TEMAZEPAM 7.5 MG PO CAPS: 7.5000 mg | ORAL_CAPSULE | Freq: Every evening | ORAL | Status: DC | PRN

## 2013-10-21 MED ORDER — ALPRAZOLAM 1 MG PO TABS: 1.0000 mg | ORAL_TABLET | Freq: Every day | ORAL | Status: DC | PRN

## 2013-10-21 MED ORDER — KETOROLAC TROMETHAMINE 30 MG/ML IJ SOLN
30.0000 mg | Freq: Once | INTRAMUSCULAR | Status: AC
  Administered 2013-10-21: 30 mg via INTRAMUSCULAR

## 2013-10-21 MED ORDER — AMOXICILLIN 875 MG PO TABS: 875.0000 mg | ORAL_TABLET | Freq: Two times a day (BID) | ORAL | Status: DC

## 2013-10-21 NOTE — Progress Notes (Signed)
   Subjective:    Patient ID: KONNER SAIZ, male    DOB: Sep 22, 1951, 63 y.o.   MRN: [id number]  HPI This 63 y.o. male presents for evaluation of insomnia.  He sleeps only 45 minutes last night. He states he has been up for over 36 hours in the past.  He has taken trazadone in the past and it has helped.  He has had sleep study 4 years ago and states it was normal.  He has depression which Is controlled well with lexapro.  He has been scheduled for total left knee surgery and needs A preoperative clearance.   Review of Systems C/o insomnia and left knee pain. No chest pain, SOB, HA, dizziness, vision change, N/V, diarrhea, constipation, dysuria, urinary urgency or frequency or rash.     Objective:   Physical Exam  Vital signs noted  Well developed well nourished male.  HEENT - Head atraumatic Normocephalic                Eyes - PERRLA, Conjuctiva - clear Sclera- Clear EOMI                Ears - EAC's Wnl TM's Wnl Gross Hearing WNL                Nose - Nares patent                 Throat - oropharanx wnl Respiratory - Lungs CTA bilateral Cardiac - RRR S1 and S2 without murmur GI - Abdomen soft Nontender and bowel sounds active x 4 Extremities - No edema. Neuro - Grossly intact.        Assessment & Plan:  Sciatica - Plan: ketorolac (TORADOL) 30 MG/ML injection 30 mg  Insomnia - Plan: Nocturnal polysomnography (NPSG), temazepam (RESTORIL) 7.5 MG capsule  URI (upper respiratory infection) - Plan: amoxicillin (AMOXIL) 875 MG tablet Push po fluids, rest, tylenol and motrin otc prn as directed for fever, arthralgias, and myalgias.  Follow up prn if sx's continue or persist.  Preoperative clearance - Plan: Ambulatory referral to Cardiology He has had normal labs in 11/14.  Form filled out for orthopedics.  Lysbeth Penner FNP

## 2013-10-21 NOTE — Patient Instructions (Addendum)
Ketorolac injection What is this medicine? KETOROLAC (kee toe ROLE ak) is a non-steroidal anti-inflammatory drug (NSAID). It is used to treat moderate to severe pain for up to 5 days. It is commonly used after surgery. This medicine should not be used for more than 5 days. This medicine may be used for other purposes; ask your health care provider or pharmacist if you have questions. COMMON BRAND NAME(S): Toradol What should I tell my health care provider before I take this medicine? They need to know if you have any of these conditions: -asthma, especially aspirin-sensitive asthma -bleeding problems -kidney disease -stomach bleed, ulcer, or other problem -taking aspirin, other NSAID, or probenecid -an unusual or allergic reaction to ketorolac, tromethamine, aspirin, other NSAIDs, other medicines, foods, dyes or preservatives -pregnant or trying to get pregnant -breast-feeding How should I use this medicine? This medicine is for injection into a muscle or into a vein. It is given by a health care professional in a hospital or clinic setting. Talk to your pediatrician regarding the use of this medicine in children. While this drug may be prescribed for children as young as 74 years old for selected conditions, precautions do apply. Patients over 44 years old may have a stronger reaction and need a smaller dose. Overdosage: If you think you have taken too much of this medicine contact a poison control center or emergency room at once. NOTE: This medicine is only for you. Do not share this medicine with others. What if I miss a dose? This does not apply. What may interact with this medicine? Do not take this medicine with any of the following medications: -aspirin and aspirin-like medicines -cidofovir -methotrexate -NSAIDs, medicines for pain and inflammation, like ibuprofen or naproxen -pentoxifylline -probenecid This medicine may also interact with the following  medications: -alcohol -alendronate -alprazolam -carbamazepine -diuretics -flavocoxid -fluoxetine -ginkgo -lithium -medicines for blood pressure like enalapril -medicines that affect platelets like pentoxifylline -medicines that treat or prevent blood clots like heparin, warfarin -muscle relaxants -pemetrexed -phenytoin -thiothixene This list may not describe all possible interactions. Give your health care provider a list of all the medicines, herbs, non-prescription drugs, or dietary supplements you use. Also tell them if you smoke, drink alcohol, or use illegal drugs. Some items may interact with your medicine. What should I watch for while using this medicine? Tell your doctor or healthcare professional if your symptoms do not start to get better or if they get worse. This medicine does not prevent heart attack or stroke. In fact, this medicine may increase the chance of a heart attack or stroke. The chance may increase with longer use of this medicine and in people who have heart disease. If you take aspirin to prevent heart attack or stroke, talk with your doctor or health care professional. Do not take medicines such as ibuprofen and naproxen with this medicine. Side effects such as stomach upset, nausea, or ulcers may be more likely to occur. Many medicines available without a prescription should not be taken with this medicine. This medicine can cause ulcers and bleeding in the stomach and intestines at any time during treatment. Do not smoke cigarettes or drink alcohol. These increase irritation to your stomach and can make it more susceptible to damage from this medicine. Ulcers and bleeding can happen without warning symptoms and can cause death. This medicine can cause you to bleed more easily. Try to avoid damage to your teeth and gums when you brush or floss your teeth. What side effects may  I notice from receiving this medicine? Side effects that you should report to your  doctor or health care professional as soon as possible: -allergic reactions like skin rash, itching or hives, swelling of the face, lips, or tongue -breathing problems -high blood pressure -nausea, vomiting -redness, blistering, peeling or loosening of the skin, including inside the mouth -severe stomach pain -signs and symptoms of bleeding such as bloody or black, tarry stools; red or dark-brown urine; spitting up blood or brown material that looks like coffee grounds; red spots on the skin; unusual bruising or bleeding from the eye, gums, or nose -signs and symptoms of a blood clot changes in vision; chest pain; severe, sudden headache; trouble speaking; sudden numbness or weakness of the face, arm, or leg -trouble passing urine or change in the amount of urine -unexplained weight gain or swelling -unusually weak or tired -yellowing of eyes or skin  Side effects that usually do not require medical attention (report to your doctor or health care professional if they continue or are bothersome): -diarrhea -dizziness -headache -heartburn This list may not describe all possible side effects. Call your doctor for medical advice about side effects. You may report side effects to FDA at 1-800-FDA-1088. Where should I keep my medicine? This drug is given in a hospital or clinic and will not be stored at home. NOTE: This sheet is a summary. It may not cover all possible information. If you have questions about this medicine, talk to your doctor, pharmacist, or health care provider.  2014, [employer] Standard. (2013-02-10 16:34:56) Insomnia Insomnia is frequent trouble falling and/or staying asleep. Insomnia can be a long term problem or a short term problem. Both are common. Insomnia can be a short term problem when the wakefulness is related to a certain stress or worry. Long term insomnia is often related to ongoing stress during waking hours and/or poor sleeping habits. Overtime, sleep  deprivation itself can make the problem worse. Every little thing feels more severe because you are overtired and your ability to cope is decreased. CAUSES   Stress, anxiety, and depression.  Poor sleeping habits.  Distractions such as TV in the bedroom.  Naps close to bedtime.  Engaging in emotionally charged conversations before bed.  Technical reading before sleep.  Alcohol and other sedatives. They may make the problem worse. They can hurt normal sleep patterns and normal dream activity.  Stimulants such as caffeine for several hours prior to bedtime.  Pain syndromes and shortness of breath can cause insomnia.  Exercise late at night.  Changing time zones may cause sleeping problems (jet lag). It is sometimes helpful to have someone observe your sleeping patterns. They should look for periods of not breathing during the night (sleep apnea). They should also look to see how long those periods last. If you live alone or observers are uncertain, you can also be observed at a sleep clinic where your sleep patterns will be professionally monitored. Sleep apnea requires a checkup and treatment. Give your caregivers your medical history. Give your caregivers observations your family has made about your sleep.  SYMPTOMS   Not feeling rested in the morning.  Anxiety and restlessness at bedtime.  Difficulty falling and staying asleep. TREATMENT   Your caregiver may prescribe treatment for an underlying medical disorders. Your caregiver can give advice or help if you are using alcohol or other drugs for self-medication. Treatment of underlying problems will usually eliminate insomnia problems.  Medications can be prescribed for short time use. They  are generally not recommended for lengthy use.  Over-the-counter sleep medicines are not recommended for lengthy use. They can be habit forming.  You can promote easier sleeping by making lifestyle changes such as:  Using relaxation  techniques that help with breathing and reduce muscle tension.  Exercising earlier in the day.  Changing your diet and the time of your last meal. No night time snacks.  Establish a regular time to go to bed.  Counseling can help with stressful problems and worry.  Soothing music and white noise may be helpful if there are background noises you cannot remove.  Stop tedious detailed work at least one hour before bedtime. HOME CARE INSTRUCTIONS   Keep a diary. Inform your caregiver about your progress. This includes any medication side effects. See your caregiver regularly. Take note of:  Times when you are asleep.  Times when you are awake during the night.  The quality of your sleep.  How you feel the next day. This information will help your caregiver care for you.  Get out of bed if you are still awake after 15 minutes. Read or do some quiet activity. Keep the lights down. Wait until you feel sleepy and go back to bed.  Keep regular sleeping and waking hours. Avoid naps.  Exercise regularly.  Avoid distractions at bedtime. Distractions include watching television or engaging in any intense or detailed activity like attempting to balance the household checkbook.  Develop a bedtime ritual. Keep a familiar routine of bathing, brushing your teeth, climbing into bed at the same time each night, listening to soothing music. Routines increase the success of falling to sleep faster.  Use relaxation techniques. This can be using breathing and muscle tension release routines. It can also include visualizing peaceful scenes. You can also help control troubling or intruding thoughts by keeping your mind occupied with boring or repetitive thoughts like the old concept of counting sheep. You can make it more creative like imagining planting one beautiful flower after another in your backyard garden.  During your day, work to eliminate stress. When this is not possible use some of the  previous suggestions to help reduce the anxiety that accompanies stressful situations. MAKE SURE YOU:   Understand these instructions.  Will watch your condition.  Will get help right away if you are not doing well or get worse. Document Released: 09/21/2000 Document Revised: 12/17/2011 Document Reviewed: 10/22/2007 [facility] Patient Information 2014 [place].

## 2013-10-21 NOTE — Progress Notes (Signed)
   Subjective:    Patient ID: [name], male    DOB: [birth year], 63 y.o.   MRN: [id number]  HPI    Review of Systems     Objective:   Physical Exam        Assessment & Plan:  Anxiety - Patient states that the clonazepam didn't work and wants xanax 1mg  for upcoming surgery Will rx #20 no refill and explained that with him on restoril I am not comfortable in rx a lot of xanax  Lysbeth Penner FNP

## 2013-10-26 ENCOUNTER — Telehealth: Payer: Self-pay | Admitting: Cardiovascular Disease

## 2013-10-26 NOTE — Telephone Encounter (Signed)
New Message  Pt will have surgery on Feb 16th ( Knee Replacement) Pt states that a stress test is needed for the completion of surgical clearance. No orders in system. Please call back.

## 2013-10-26 NOTE — Telephone Encounter (Signed)
Pt called for an appointment for surgical clearance for knee replacement. Pt was seen in this office in 2010 by Dr. Johnsie Cancel. Pt states had a cardiac cath. The same year wish was normal. Because of his age and being overweigh pt needs to be clear for knee replacement. An appointment was made as a new pt with Dr. Meda Coffee.

## 2013-10-27 NOTE — Telephone Encounter (Signed)
Patient picked up RX on 10-22-13.

## 2013-10-27 NOTE — Telephone Encounter (Signed)
Pt's new appointment was placed on a 15 min slot. Neil Crouch is aware and will fixed to be in a 30 minute.

## 2013-10-28 ENCOUNTER — Other Ambulatory Visit: Payer: Self-pay | Admitting: Physical Medicine and Rehabilitation

## 2013-10-28 ENCOUNTER — Encounter: Payer: Self-pay | Admitting: *Deleted

## 2013-10-28 DIAGNOSIS — M79605 Pain in left leg: Secondary | ICD-10-CM

## 2013-10-29 ENCOUNTER — Ambulatory Visit (INDEPENDENT_AMBULATORY_CARE_PROVIDER_SITE_OTHER): Payer: BC Managed Care – PPO | Admitting: Family Medicine

## 2013-10-29 ENCOUNTER — Other Ambulatory Visit: Payer: Self-pay | Admitting: Family Medicine

## 2013-10-29 ENCOUNTER — Encounter: Payer: Self-pay | Admitting: Family Medicine

## 2013-10-29 VITALS — BP 158/89 | HR 96 | Temp 98.8°F | Ht 66.0 in | Wt 257.0 lb

## 2013-10-29 DIAGNOSIS — G4700 Insomnia, unspecified: Secondary | ICD-10-CM

## 2013-10-29 DIAGNOSIS — L0390 Cellulitis, unspecified: Secondary | ICD-10-CM

## 2013-10-29 DIAGNOSIS — M1990 Unspecified osteoarthritis, unspecified site: Secondary | ICD-10-CM

## 2013-10-29 DIAGNOSIS — L0291 Cutaneous abscess, unspecified: Secondary | ICD-10-CM

## 2013-10-29 DIAGNOSIS — M129 Arthropathy, unspecified: Secondary | ICD-10-CM

## 2013-10-29 LAB — POCT CBC
Granulocyte percent: 78.1 %G (ref 37–80)
HCT, POC: 40.9 % — AB (ref 43.5–53.7)
Hemoglobin: 13.1 g/dL — AB (ref 14.1–18.1)
Lymph, poc: 3.3 (ref 0.6–3.4)
MCH, POC: 29 pg (ref 27–31.2)
MCHC: 32.1 g/dL (ref 31.8–35.4)
MCV: 90.4 fL (ref 80–97)
MPV: 7.3 fL (ref 0–99.8)
POC Granulocyte: 14.5 — AB (ref 2–6.9)
POC LYMPH PERCENT: 17.5 %L (ref 10–50)
Platelet Count, POC: 320 10*3/uL (ref 142–424)
RBC: 4.5 M/uL — AB (ref 4.69–6.13)
RDW, POC: 14.7 %
WBC: 18.6 10*3/uL — AB (ref 4.6–10.2)

## 2013-10-29 MED ORDER — DOXYCYCLINE HYCLATE 100 MG PO TABS: 100.0000 mg | ORAL_TABLET | Freq: Two times a day (BID) | ORAL | Status: DC

## 2013-10-29 MED ORDER — LEVOFLOXACIN 500 MG PO TABS: 500.0000 mg | ORAL_TABLET | Freq: Every day | ORAL | Status: DC

## 2013-10-29 MED ORDER — MELOXICAM 15 MG PO TABS: 15.0000 mg | ORAL_TABLET | Freq: Every day | ORAL | Status: DC

## 2013-10-29 MED ORDER — ZOLPIDEM TARTRATE 10 MG PO TABS: 10.0000 mg | ORAL_TABLET | Freq: Every evening | ORAL | Status: DC | PRN

## 2013-10-29 NOTE — Patient Instructions (Signed)
Cellulitis Cellulitis is an infection of the skin and the tissue beneath it. The infected area is usually red and tender. Cellulitis occurs most often in the arms and lower legs.  CAUSES  Cellulitis is caused by bacteria that enter the skin through cracks or cuts in the skin. The most common types of bacteria that cause cellulitis are Staphylococcus and Streptococcus. SYMPTOMS   Redness and warmth.  Swelling.  Tenderness or pain.  Fever. DIAGNOSIS  Your caregiver can usually determine what is wrong based on a physical exam. Blood tests may also be done. TREATMENT  Treatment usually involves taking an antibiotic medicine. HOME CARE INSTRUCTIONS   Take your antibiotics as directed. Finish them even if you start to feel better.  Keep the infected arm or leg elevated to reduce swelling.  Apply a warm cloth to the affected area up to 4 times per day to relieve pain.  Only take over-the-counter or prescription medicines for pain, discomfort, or fever as directed by your caregiver.  Keep all follow-up appointments as directed by your caregiver. SEEK MEDICAL CARE IF:   You notice red streaks coming from the infected area.  Your red area gets larger or turns dark in color.  Your bone or joint underneath the infected area becomes painful after the skin has healed.  Your infection returns in the same area or another area.  You notice a swollen bump in the infected area.  You develop new symptoms. SEEK IMMEDIATE MEDICAL CARE IF:   You have a fever.  You feel very sleepy.  You develop vomiting or diarrhea.  You have a general ill feeling (malaise) with muscle aches and pains. MAKE SURE YOU:   Understand these instructions.  Will watch your condition.  Will get help right away if you are not doing well or get worse. Document Released: 07/04/2005 Document Revised: 03/25/2012 Document Reviewed: 12/10/2011 [employer] Patient Information 2014 [employer].  

## 2013-10-29 NOTE — Progress Notes (Signed)
   Subjective:    Patient ID: [name], male    DOB: 09/14/1951, 63 y.o.   MRN: [id number]  HPI  This 63 y.o. male presents for evaluation of wound on his right knee that is not helping. He is having difficulty sleeping and the restoril is not helping.  He states the only thing that Helps him sleep is zolpidem.  Review of Systems C/o insomnia and right knee cellulitis   No chest pain, SOB, HA, dizziness, vision change, N/V, diarrhea, constipation, dysuria, urinary urgency or frequency, myalgias, arthralgias or rash.  Objective:   Physical Exam Vital signs noted  Well developed well nourished male.  HEENT - Head atraumatic Normocephalic                Eyes - PERRLA, Conjuctiva - clear Sclera- Clear EOMI                Ears - EAC's Wnl TM's Wnl Gross Hearing WNL                Throat - oropharanx wnl Respiratory - Lungs CTA bilateral Cardiac - RRR S1 and S2 without murmur Skin - Cellulitis Right Patella       Assessment & Plan:  Insomnia - Plan: zolpidem (AMBIEN) 10 MG tablet  Cellulitis - Plan: doxycycline (VIBRA-TABS) 100 MG tablet, POCT CBC, BMP8+EGFR  Arthritis - Plan: meloxicam (MOBIC) 15 MG tablet  Lysbeth Penner FNP

## 2013-10-30 ENCOUNTER — Encounter: Payer: Self-pay | Admitting: Family Medicine

## 2013-10-30 ENCOUNTER — Ambulatory Visit (INDEPENDENT_AMBULATORY_CARE_PROVIDER_SITE_OTHER): Payer: BC Managed Care – PPO | Admitting: Family Medicine

## 2013-10-30 VITALS — BP 152/83 | HR 99 | Temp 96.0°F | Ht 66.0 in | Wt 262.0 lb

## 2013-10-30 DIAGNOSIS — E119 Type 2 diabetes mellitus without complications: Secondary | ICD-10-CM

## 2013-10-30 LAB — BMP8+EGFR
BUN/Creatinine Ratio: 20 (ref 10–22)
BUN: 17 mg/dL (ref 8–27)
CO2: 23 mmol/L (ref 18–29)
Calcium: 9.8 mg/dL (ref 8.6–10.2)
Chloride: 96 mmol/L — ABNORMAL LOW (ref 97–108)
Creatinine, Ser: 0.87 mg/dL (ref 0.76–1.27)
GFR calc Af Amer: 107 mL/min/{1.73_m2} (ref >59)
GFR calc non Af Amer: 92 mL/min/{1.73_m2} (ref >59)
Glucose: 364 mg/dL — ABNORMAL HIGH (ref 65–99)
Potassium: 4.7 mmol/L (ref 3.5–5.2)
Sodium: 137 mmol/L (ref 134–144)

## 2013-10-30 NOTE — Progress Notes (Signed)
   Subjective:    Patient ID: YAXIEL MINNIE, male    DOB: 1951/01/07, 63 y.o.   MRN: [id number]  HPI  This 63 y.o. male presents for evaluation of follow up on labs.  His wbc's were elevated at 18.6 and his glucose was elevated.  He is not due for hgbaic.  His fsbs this am was in the 160's.  His fsbs right Now is 195.  He is having a sinus infection and has been  rx'd levaquin 500mg  one po qd x 14 days.  Review of Systems    No chest pain, SOB, HA, dizziness, vision change, N/V, diarrhea, constipation, dysuria, urinary urgency or frequency, myalgias, arthralgias or rash.  Objective:   Physical Exam  Vital signs noted  Well developed well nourished male.  HEENT - Head atraumatic Normocephalic                Eyes - PERRLA, Conjuctiva - clear Sclera- Clear EOMI                Ears - EAC's Wnl TM's Wnl Gross Hearing WNL Respiratory - Lungs CTA bilateral Cardiac - RRR S1 and S2 without murmur       Assessment & Plan:  Diabetes Invokana 100mg  po qd #15 samples. Take this while he is on the levaquin.  Will send in rx if he needs this. Lysbeth Penner FNP

## 2013-11-02 ENCOUNTER — Telehealth: Payer: Self-pay | Admitting: Family Medicine

## 2013-11-02 ENCOUNTER — Ambulatory Visit (INDEPENDENT_AMBULATORY_CARE_PROVIDER_SITE_OTHER): Payer: BC Managed Care – PPO | Admitting: Cardiology

## 2013-11-02 ENCOUNTER — Encounter: Payer: Self-pay | Admitting: Cardiology

## 2013-11-02 VITALS — BP 140/90 | HR 107 | Ht 67.0 in | Wt 253.8 lb

## 2013-11-02 DIAGNOSIS — Z01810 Encounter for preprocedural cardiovascular examination: Secondary | ICD-10-CM

## 2013-11-02 MED ORDER — ESCITALOPRAM OXALATE 10 MG PO TABS: 10.0000 mg | ORAL_TABLET | Freq: Every day | ORAL | Status: DC

## 2013-11-02 MED ORDER — METOPROLOL SUCCINATE ER 25 MG PO TB24: 25.0000 mg | ORAL_TABLET | Freq: Every day | ORAL | Status: DC

## 2013-11-02 NOTE — Progress Notes (Signed)
Patient ID: [name], male   DOB: Jun 08, 1951, 63 y.o.   MRN: [id number]    Patient Name: [name] Date of Encounter: 11/02/2013  Primary Care Provider:  Redge Gainer, MD Primary Cardiologist:  [name], H  Problem List   Past Medical History  Diagnosis Date  . Hypertension   . High cholesterol   . High triglycerides   . Diabetes mellitus without complication   . Depression   . Anxiety    Past Surgical History  Procedure Laterality Date  . Knee surgery Right   . Kidney stone surgery    . Eye surgery    . Cardiac catheterization    . Esophagogastroduodenoscopy      Allergies  Allergies  Allergen Reactions  . Amoxicillin-Pot Clavulanate Nausea And Vomiting  . Augmentin [Amoxicillin-Pot Clavulanate]   . Prednisone Other (See Comments)    Ulcers    HPI  A pleasant 63 year old male with history of hypertension, hyper lipidemia, known insulin-dependent diabetes mellitus, obesity who was previously evaluated for coronary artery disease and seen by Dr. Percival Fritz and Dr [name] in the past. The patient had a negative stress test with graded exercise capacity and no ischemia or scar in 2004. In 2006 he had a normal cardiac catheterization. In 2010 he was evaluated for atypical chest pain and shortness of breath and he underwent cardiac catheterization on 03/31/2009 that showed no disease in the left main, LAD, left circumflex artery and only mild nonobstructive disease in the right coronary artery. The patient is now coming for preoperative evaluation prior to the left knee replacement. The patient denies any chest pain, shortness of breath, palpitation, syncope, orthopnea or personal nocturnal dyspnea as well as lower extremity swelling. He states he can easily walk at least a block as much as he is knee pain permits him. He doesn't feel limited in his activities by chest pain or shortness of breath. Family history is noncontributory. He is compliant with his  medications. He does not smoke or drink.   Home Medications  Prior to Admission medications   Medication Sig Start Date End Date Taking? Authorizing Provider  ALPRAZolam Duanne Moron) 1 MG tablet Take 1 tablet (1 mg total) by mouth daily as needed for anxiety. 10/21/13  Yes Lysbeth Penner, FNP  amoxicillin (AMOXIL) 875 MG tablet Take 1 tablet (875 mg total) by mouth 2 (two) times daily. 10/21/13  Yes Lysbeth Penner, FNP  aspirin 81 MG tablet Take 81 mg by mouth daily.   Yes Historical Provider, MD  diclofenac sodium (VOLTAREN) 1 % GEL Apply 2 g topically 4 (four) times daily.   Yes Historical Provider, MD  doxycycline (VIBRA-TABS) 100 MG tablet Take 1 tablet (100 mg total) by mouth 2 (two) times daily. 10/29/13  Yes Lysbeth Penner, FNP  escitalopram (LEXAPRO) 10 MG tablet Take 1 tablet (10 mg total) by mouth daily. 08/28/13  Yes Shanda Howells, MD  fenofibrate (TRICOR) 145 MG tablet Take 1 tablet (145 mg total) by mouth daily. 08/28/13  Yes Shanda Howells, MD  fluticasone (FLONASE) 50 MCG/ACT nasal spray Place 2 sprays into the nose daily.   Yes Historical Provider, MD  levofloxacin (LEVAQUIN) 500 MG tablet Take 1 tablet (500 mg total) by mouth daily. 10/29/13  Yes Lysbeth Penner, FNP  lisinopril (PRINIVIL,ZESTRIL) 10 MG tablet Take 1 tablet (10 mg total) by mouth daily. 08/28/13  Yes Shanda Howells, MD  meloxicam (MOBIC) 15 MG tablet Take 1 tablet (15 mg total) by mouth daily.  10/29/13  Yes Lysbeth Penner, FNP  Multiple Vitamin (MULTIVITAMIN WITH MINERALS) TABS tablet Take 1 tablet by mouth daily.   Yes Historical Provider, MD  nabumetone (RELAFEN) 500 MG tablet Take 500 mg by mouth 2 (two) times daily.   Yes Historical Provider, MD  rosuvastatin (CRESTOR) 40 MG tablet Take 0.5 tablets (20 mg total) by mouth daily. 08/31/13  Yes Shanda Howells, MD  tamsulosin (FLOMAX) 0.4 MG CAPS capsule Take 1 capsule (0.4 mg total) by mouth daily after supper. 08/28/13  Yes Shanda Howells, MD  zolpidem (AMBIEN) 10  MG tablet Take 1 tablet (10 mg total) by mouth at bedtime as needed for sleep. 10/29/13 11/28/13 Yes Lysbeth Penner, FNP   Family History  Family History  Problem Relation Age of Onset  . Diabetes Mother   . Heart disease Mother   . Diabetes Father   . Heart disease Father   . Arthritis Father     Social History  History   Social History  . Marital Status: Married    Spouse Name: N/A    Number of Children: N/A  . Years of Education: N/A   Occupational History  . Not on file.   Social History Main Topics  . Smoking status: Never Smoker   . Smokeless tobacco: Not on file  . Alcohol Use: No  . Drug Use: No  . Sexual Activity: No   Other Topics Concern  . Not on file   Social History Narrative  . No narrative on file     Review of Systems, as per HPI, otherwise negative General:  No chills, fever, night sweats or weight changes.  Cardiovascular:  No chest pain, dyspnea on exertion, edema, orthopnea, palpitations, paroxysmal nocturnal dyspnea. Dermatological: No rash, lesions/masses Respiratory: No cough, dyspnea Urologic: No hematuria, dysuria Abdominal:   No nausea, vomiting, diarrhea, bright red blood per rectum, melena, or hematemesis Neurologic:  No visual changes, wkns, changes in mental status. All other systems reviewed and are otherwise negative except as noted above.  Physical Exam  Blood pressure 140/90, pulse 107, height 5\' 7"  (1.702 m), weight 253 lb 12.8 oz (115.123 kg).  General: Pleasant, NAD Psych: Normal affect. Neuro: Alert and oriented X 3. Moves all extremities spontaneously. HEENT: Normal  Neck: Supple without bruits or JVD. Lungs:  Resp regular and unlabored, CTA. Heart: RRR no s3, s4, or murmurs. Abdomen: Soft, non-tender, non-distended, BS + x 4.  Extremities: No clubbing, cyanosis or edema. DP/PT/Radials 2+ and equal bilaterally.  Labs:  No results found for this basename: CKTOTAL, CKMB, TROPONINI,  in the last 72 hours Lab  Results  Component Value Date   WBC 18.6* 10/29/2013   HGB 13.1* 10/29/2013   HCT 40.9* 10/29/2013   MCV 90.4 10/29/2013   PLT 316 08/28/2013    Recent Labs Lab 10/29/13 1001  NA 137  K 4.7  CL 96*  CO2 23  BUN 17  CREATININE 0.87  CALCIUM 9.8  GLUCOSE 364*   Lab Results  Component Value Date   CHOL 180 08/28/2013   HDL 29* 03/31/2009   LDLCALC  Value: 70        Total Cholesterol/HDL:CHD Risk Coronary Heart Disease Risk Table                     Men   Women  1/2 Average Risk   3.4   3.3  Average Risk       5.0   4.4  2 X Average  Risk   9.6   7.1  3 X Average Risk  23.4   11.0        Use the calculated Patient Ratio above and the CHD Risk Table to determine the patient's CHD Risk.        ATP III CLASSIFICATION (LDL):  <100     mg/dL   Optimal  100-129  mg/dL   Near or Above                    Optimal  130-159  mg/dL   Borderline  160-189  mg/dL   High  >190     mg/dL   Very High 03/31/2009   TRIG 146 03/31/2009   Accessory Clinical Findings  Echocardiogram   ECG - sinus tachycardia, otherwise normal EKG  Exercise nuclear stress test 2004: IMPRESSION  Negative stress Cardiolite study, revealing adequate exercise tolerance, a  normal stress EKG, and normal left ventricular size, and normal left  ventricular systolic function. By scintigraphic imaging, there was  evidence for soft tissue and diaphragmatic attenuation, but no convincing  findings for myocardial infarction or ischemia. Other findings as noted.  Cath 03/31/2009 RESULTS: The left main coronary artery: The left main coronary artery  was free of significant disease.  The left anterior descending artery: The left anterior descending  artery gave rise to a small and large diagonal branch and several septal  perforators. These and LAD proper were free of significant disease.  The circumflex artery: The circumflex artery gave rise to a marginal  branch and a posterolateral branch. These vessels were free of  significant  disease.  The right coronary artery: The right coronary artery was a large  vessel, gave rise to posterior descending branch and 4 posterolateral  branches. There was 40% narrowing in the proximal right coronary  artery. This vessel had mild irregularities.  The left ventriculogram: The left ventriculogram performed in the RAO  projection showed good wall motion with no areas of hypokinesis. The  estimated ejection fraction was 60%.  HEMODYNAMIC DATA: The right atrial pressure was 6 mean. Pulmonary  artery pressure was 25/5 with a mean of 14. Pulmonary wedge pressure  was 6.  CONCLUSION:  1. Mild nonobstructive coronary artery disease with 40% narrowing in  the proximal right coronary artery and no significant obstruction  in the left anterior descending and circumflex arteries.  2. Normal pulmonary artery pressures.   Assessment & Plan   This is a very pleasant 63 year old male with a history of non-insulin-dependent diabetes mellitus, hypertension, obesity and hyperlipidemia followed by a primary care physician and treated with fibroids and statins, and known mild nonobstructive coronary artery disease on cath in 2010.  The patient is currently free of symptoms that would suggest angina or heart failure. He can definitely achieve at least 4 METS. His EKG is normal other than sinus tachycardia that is most probably attributed to whitecoat syndrome as well as is his borderline blood pressure. There is currently no contraindication for this gentleman to undergo history knee replacement from cardiac standpoint. No further cardiac work up is necessary prior to the knee replacement. Considering he has tachycardia and elevated blood pressure at stress we would recommend to use a low dose of Toprol XL 25 mg in a perioperative period.  Thank you for the consultation. Please call us with any questions.  Follow up in 1 year.   Dorothy Spark, MD, [facility] 11/02/2013, 2:53 PM

## 2013-11-02 NOTE — Patient Instructions (Signed)
Your physician has recommended you make the following change in your medication:   1. Start Toprol XL 25 mg daily.   Your physician recommends that you schedule a follow-up appointment as needed.   You are cleared for surgery. Dr. Meda Coffee will send note to surgeon.

## 2013-11-02 NOTE — Telephone Encounter (Signed)
Pt notified [facility]

## 2013-11-05 ENCOUNTER — Other Ambulatory Visit: Payer: Self-pay | Admitting: Physical Medicine and Rehabilitation

## 2013-11-05 ENCOUNTER — Ambulatory Visit
Admission: RE | Admit: 2013-11-05 | Discharge: 2013-11-05 | Disposition: A | Discharge status: Home / Self Care | Payer: BC Managed Care – PPO | Source: Ambulatory Visit | Attending: Physical Medicine and Rehabilitation | Admitting: Physical Medicine and Rehabilitation

## 2013-11-05 ENCOUNTER — Encounter (HOSPITAL_COMMUNITY): Payer: Self-pay | Admitting: Pharmacy Technician

## 2013-11-05 DIAGNOSIS — M25552 Pain in left hip: Secondary | ICD-10-CM

## 2013-11-05 DIAGNOSIS — M79605 Pain in left leg: Secondary | ICD-10-CM

## 2013-11-05 MED ORDER — GADOBENATE DIMEGLUMINE 529 MG/ML IV SOLN
20.0000 mL | Freq: Once | INTRAVENOUS | Status: AC | PRN
  Administered 2013-11-05: 20 mL via INTRAVENOUS

## 2013-11-06 ENCOUNTER — Ambulatory Visit: Payer: BC Managed Care – PPO | Admitting: Family Medicine

## 2013-11-10 ENCOUNTER — Other Ambulatory Visit: Payer: Self-pay | Admitting: Family Medicine

## 2013-11-11 ENCOUNTER — Other Ambulatory Visit: Payer: Self-pay | Admitting: Physician Assistant

## 2013-11-11 ENCOUNTER — Encounter: Payer: Self-pay | Admitting: Physician Assistant
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
## 2013-11-11 NOTE — Telephone Encounter (Signed)
Rxs last filled on 10-29-13. Please advise

## 2013-11-12 ENCOUNTER — Ambulatory Visit
Admission: RE | Admit: 2013-11-12 | Discharge: 2013-11-12 | Disposition: A | Discharge status: Home / Self Care | Payer: BC Managed Care – PPO | Source: Ambulatory Visit | Attending: Physical Medicine and Rehabilitation | Admitting: Physical Medicine and Rehabilitation

## 2013-11-12 DIAGNOSIS — M25552 Pain in left hip: Secondary | ICD-10-CM

## 2013-11-12 NOTE — Pre-Procedure Instructions (Signed)
MICO SPARK  11/12/2013   Your procedure is scheduled on: Monday, February 16.  Report to [facility], Main Entrance or Entrance "A" at 9:00 AM.  Call this number if you have problems the morning of surgery: [phone]   Remember:   Do not eat food or drink liquids after midnight.   Take these medicines the morning of surgery with A SIP OF WATER: escitalopram (LEXAPRO),metoprolol succinate (TOPROL XL). Stop taking all non steroid anti inflammatory medication (Relafen) Mobic.  Stop all herbal medications including fish oil and vitamins.   Do not wear jewelry, make-up or nail polish.  Do not wear lotions, powders, or perfumes. You may wear deodorant.  Do not shave 48 hours prior to surgery. Men may shave face and neck.  Do not bring valuables to the hospital.  [facility] is not responsible for any belongings or valuables.               Contacts, dentures or bridgework may not be worn into surgery.  Leave suitcase in the car. After surgery it may be brought to your room.  For patients admitted to the hospital, discharge time is determined by your treatment team.               Special Instructions: Shower with CHG wash (Bactoshield) tonight and again in the am prior to arriving to hospital.   Please read over the following fact sheets that you were given: Pain Booklet, Coughing and Deep Breathing, Blood Transfusion Information and Surgical Site Infection Prevention

## 2013-11-13 ENCOUNTER — Inpatient Hospital Stay (HOSPITAL_COMMUNITY)
Admission: RE | Admit: 2013-11-13 | Discharge: 2013-11-13 | Disposition: A | Discharge status: Home / Self Care | Payer: BC Managed Care – PPO | Source: Ambulatory Visit

## 2013-11-13 ENCOUNTER — Other Ambulatory Visit: Payer: Self-pay

## 2013-11-13 NOTE — Telephone Encounter (Signed)
Last seen 10/30/13  B Oxford  Last glucose 11/11/13   If approved route to nurse to call into [facility]   [phone]

## 2013-11-16 MED ORDER — ALPRAZOLAM 1 MG PO TABS: 1.0000 mg | ORAL_TABLET | Freq: Every day | ORAL | Status: DC | PRN

## 2013-11-16 MED ORDER — CANAGLIFLOZIN 100 MG PO TABS: 100.0000 | ORAL_TABLET | Freq: Every day | ORAL | Status: DC

## 2013-11-16 NOTE — Telephone Encounter (Signed)
Called in.

## 2013-11-19 ENCOUNTER — Other Ambulatory Visit (HOSPITAL_COMMUNITY): Payer: BC Managed Care – PPO

## 2013-11-21 ENCOUNTER — Other Ambulatory Visit: Payer: Self-pay | Admitting: Family Medicine

## 2013-11-27 ENCOUNTER — Encounter: Payer: Self-pay | Admitting: Physician Assistant

## 2013-11-27 ENCOUNTER — Ambulatory Visit (INDEPENDENT_AMBULATORY_CARE_PROVIDER_SITE_OTHER): Payer: BC Managed Care – PPO | Admitting: Physician Assistant

## 2013-11-27 VITALS — BP 157/88 | HR 95 | Temp 99.1°F | Ht 66.0 in | Wt 258.0 lb

## 2013-11-27 DIAGNOSIS — A499 Bacterial infection, unspecified: Secondary | ICD-10-CM

## 2013-11-27 DIAGNOSIS — J329 Chronic sinusitis, unspecified: Secondary | ICD-10-CM

## 2013-11-27 DIAGNOSIS — B9689 Other specified bacterial agents as the cause of diseases classified elsewhere: Secondary | ICD-10-CM

## 2013-11-27 MED ORDER — LEVOFLOXACIN 500 MG PO TABS: 500.0000 mg | ORAL_TABLET | Freq: Every day | ORAL | Status: DC

## 2013-11-30 ENCOUNTER — Other Ambulatory Visit: Payer: Self-pay | Admitting: Family Medicine

## 2013-11-30 ENCOUNTER — Encounter: Payer: Self-pay | Admitting: Family Medicine

## 2013-11-30 ENCOUNTER — Ambulatory Visit (INDEPENDENT_AMBULATORY_CARE_PROVIDER_SITE_OTHER): Payer: BC Managed Care – PPO | Admitting: Family Medicine

## 2013-11-30 VITALS — BP 120/81 | HR 75 | Temp 97.8°F | Ht 66.0 in | Wt 255.0 lb

## 2013-11-30 DIAGNOSIS — L282 Other prurigo: Secondary | ICD-10-CM

## 2013-11-30 DIAGNOSIS — M5430 Sciatica, unspecified side: Secondary | ICD-10-CM

## 2013-11-30 DIAGNOSIS — G4700 Insomnia, unspecified: Secondary | ICD-10-CM

## 2013-11-30 MED ORDER — ZOLPIDEM TARTRATE 10 MG PO TABS: 10.0000 mg | ORAL_TABLET | Freq: Every evening | ORAL | Status: AC | PRN

## 2013-11-30 MED ORDER — PREDNISONE 50 MG PO TABS: ORAL_TABLET | ORAL | Status: DC

## 2013-11-30 MED ORDER — ALPRAZOLAM 1 MG PO TABS: 1.0000 mg | ORAL_TABLET | Freq: Every day | ORAL | Status: DC | PRN

## 2013-11-30 MED ORDER — GABAPENTIN 100 MG PO CAPS: 100.0000 mg | ORAL_CAPSULE | Freq: Three times a day (TID) | ORAL | Status: DC

## 2013-11-30 MED ORDER — HYDROXYZINE HCL 25 MG PO TABS: 25.0000 mg | ORAL_TABLET | Freq: Three times a day (TID) | ORAL | Status: DC | PRN

## 2013-11-30 NOTE — Progress Notes (Signed)
   Subjective:    Patient ID: [name], male    DOB: [birth year], 62 y.o.   MRN: [id number]  HPI Patient presents today with 2 complaints. Rash: Reports pruritic rash on dorsal aspect of left forearm. Has had persistent itching. Developed secondary rash area of scratching. No fevers or chills. Denies any new medications, new detergents, new clothing. Doesn't Ron Parker at home to use with chronically. Denies any scratching of them.  Sciatica: Has a long-standing history of left-sided sciatica. Is followed at [facility] ortho for this issue as well as pending left total knee replacement. Status post nerve cautery. Still having severe pain.     Review of Systems  All other systems reviewed and are negative.       Objective:   Physical Exam  Constitutional:  Morbidly obese  HENT:  Head: Normocephalic and atraumatic.  Eyes: Conjunctivae are normal. Pupils are equal, round, and reactive to light.  Neck: Normal range of motion.  Cardiovascular: Normal rate and regular rhythm.   Pulmonary/Chest: Effort normal.  Abdominal: Soft.  Musculoskeletal:  Positive tenderness to palpation in the left lumbosacral region. Neurovascularly intact distally.  Neurological: He is alert.  Skin: Skin is warm.          Assessment & Plan:  Pruritic rash - Plan: predniSONE (DELTASONE) 50 MG tablet, hydrOXYzine (ATARAX/VISTARIL) 25 MG tablet  Sciatica - Plan: gabapentin (NEURONTIN) 100 MG capsule   We'll place on prednisone for treatment as this will cover both pruritic rash and sciatica. Pruritic rash of unclear etiology. ? Urticarial reaction.  Questionable prednisone allergy in the past with questionable ulceration in the left axilla. History sounds more consistent with hidradenitis of the affected area. As I questionable steroid injections in the past with no issues. Will give trial of prednisone. May stop if patient develops any ulcerations. Watch sugars closely.  Atarax for  itching for pruritic rash. Neurontin long term for sciatic pain in conjunction with orthopedic followup. Discussed [facility] and derm red flags.  Follow up as needed.

## 2013-12-02 ENCOUNTER — Encounter: Payer: Self-pay | Admitting: Family Medicine

## 2013-12-02 ENCOUNTER — Ambulatory Visit (INDEPENDENT_AMBULATORY_CARE_PROVIDER_SITE_OTHER): Payer: BC Managed Care – PPO | Admitting: Family Medicine

## 2013-12-02 VITALS — BP 161/93 | HR 89 | Temp 96.7°F | Ht 66.0 in | Wt 257.8 lb

## 2013-12-02 DIAGNOSIS — E781 Pure hyperglyceridemia: Secondary | ICD-10-CM

## 2013-12-02 DIAGNOSIS — M5430 Sciatica, unspecified side: Secondary | ICD-10-CM

## 2013-12-02 DIAGNOSIS — E119 Type 2 diabetes mellitus without complications: Secondary | ICD-10-CM

## 2013-12-02 MED ORDER — CANAGLIFLOZIN 100 MG PO TABS: 1.0000 | ORAL_TABLET | Freq: Every day | ORAL | Status: DC

## 2013-12-02 MED ORDER — GABAPENTIN 300 MG PO CAPS: 300.0000 mg | ORAL_CAPSULE | Freq: Three times a day (TID) | ORAL | Status: DC

## 2013-12-02 MED ORDER — FENOFIBRATE 145 MG PO TABS: 145.0000 mg | ORAL_TABLET | Freq: Every day | ORAL | Status: DC

## 2013-12-02 NOTE — Progress Notes (Signed)
   Subjective:    Patient ID: [name], male    DOB: October 02, 1951, 63 y.o.   MRN: [id number]  HPI This 63 y.o. male presents for evaluation of back pain and the pain is radiating down his Left leg.  He is asking for refills on his xanax which was just refilled.  He was in the other day Seeing Dr. Ernestina Patches with the same complaints and was rx'd gabapentin which he has been taking and states it is not helping.  He has had some injections in his spine and some cautery which he states has not helped.  He has DDD of the LS spine and spinal stenosis and was referred to Dr. Elsie Saas [facility] who has rx'd hydrocodone for pain 10/20/13 according to the CSR.     Review of Systems C/o back pain and sciatic pain No chest pain, SOB, HA, dizziness, vision change, N/V, diarrhea, constipation, dysuria, urinary urgency or frequency, myalgias, arthralgias or rash.     Objective:   Physical Exam Vital signs noted  Well developed well nourished obese male.  HEENT - Head atraumatic Normocephalic                Eyes - PERRLA, Conjuctiva - clear Sclera- Clear EOMI Respiratory - Lungs CTA bilateral Cardiac - RRR S1 and S2 without murmur GI - Abdomen soft Nontender and bowel sounds active x 4 Neuro - Grossly intact. Skin - Hematoma left upper arm.     Assessment & Plan:  Sciatica - Plan: gabapentin (NEURONTIN) 300 MG capsule  High triglycerides - Plan: fenofibrate (TRICOR) 145 MG tablet  Diabetes - Plan: Canagliflozin 100 MG TABS.  Follow up in 3 months  DDD LS spine - Need to follow up with Ortho dr. Noemi Chapel for pain meds since he is writing for hydrocodone and explained he needs to go to one provider for pain medications.  Will increase his Gabapentin that was originally rx'd by Dr. Ernestina Patches.  DJD left knee - Follow up with Dr. Noemi Chapel ortho.  He is scheduled for surgery and has pre-op labs scheduled.  Contusion left arm - apply warm compress to right arm.  Lysbeth Penner FNP

## 2013-12-06 NOTE — Progress Notes (Signed)
   Subjective:    Patient ID: [name], male    DOB: [birth year], 63 y.o.   MRN: [id number]  HPI 63 y/o male with chronic sinusitis infections presents with pain/ pressure in bilateral maxillary sinuses and nasal congestion x 5 days. He states that this occurs frequently x many years. No aggravating or relieving factors.     Review of Systems  Constitutional: Positive for fever (low grade) and chills.  HENT: Positive for congestion (nasal) and sinus pressure (maxillary sinuses, bilateral ). Negative for dental problem, ear pain, facial swelling, hearing loss, nosebleeds, postnasal drip, sneezing, sore throat, tinnitus and trouble swallowing.   Eyes: Negative.   Respiratory: Negative.   Neurological: Negative.        Objective:   Physical Exam  Vitals reviewed. Constitutional: He appears well-developed and well-nourished.  Low grade fever, obese  HENT:  TTP of bilateral maxillary sinuses. Nasal turbinates clear.   Eyes: EOM are normal. Right eye exhibits no discharge. Left eye exhibits no discharge.  Cardiovascular: Normal rate, regular rhythm and normal heart sounds.  Exam reveals no gallop and no friction rub.   No murmur heard. Pulmonary/Chest: Effort normal and breath sounds normal. No respiratory distress. He has no wheezes.          Assessment & Plan:  1. Chronic bacterial sinusitis: D/T patient being allergic to amoxicillin, will prescribed Levaquin 500mg  q day. Encouraged patient to use Flonase and cool mist humidifier in addition to Netti pot or sinus rinse. RTC if s/s worsen or do not improve.

## 2013-12-17 ENCOUNTER — Encounter: Payer: Self-pay | Admitting: Family Medicine

## 2013-12-17 ENCOUNTER — Ambulatory Visit (HOSPITAL_COMMUNITY)
Admission: RE | Admit: 2013-12-17 | Discharge: 2013-12-17 | Disposition: A | Discharge status: Home / Self Care | Payer: BC Managed Care – PPO | Source: Ambulatory Visit | Attending: Family Medicine | Admitting: Family Medicine

## 2013-12-17 ENCOUNTER — Ambulatory Visit (INDEPENDENT_AMBULATORY_CARE_PROVIDER_SITE_OTHER): Payer: BC Managed Care – PPO | Admitting: Family Medicine

## 2013-12-17 VITALS — BP 172/88 | HR 108 | Temp 97.3°F | Ht 66.0 in | Wt 257.0 lb

## 2013-12-17 DIAGNOSIS — R5381 Other malaise: Secondary | ICD-10-CM

## 2013-12-17 DIAGNOSIS — F411 Generalized anxiety disorder: Secondary | ICD-10-CM

## 2013-12-17 DIAGNOSIS — M79609 Pain in unspecified limb: Secondary | ICD-10-CM | POA: Insufficient documentation

## 2013-12-17 DIAGNOSIS — L03115 Cellulitis of right lower limb: Secondary | ICD-10-CM

## 2013-12-17 DIAGNOSIS — IMO0002 Reserved for concepts with insufficient information to code with codable children: Secondary | ICD-10-CM

## 2013-12-17 DIAGNOSIS — F1910 Other psychoactive substance abuse, uncomplicated: Secondary | ICD-10-CM

## 2013-12-17 DIAGNOSIS — R5383 Other fatigue: Secondary | ICD-10-CM

## 2013-12-17 DIAGNOSIS — L03119 Cellulitis of unspecified part of limb: Secondary | ICD-10-CM

## 2013-12-17 DIAGNOSIS — R531 Weakness: Secondary | ICD-10-CM

## 2013-12-17 DIAGNOSIS — Z765 Malingerer [conscious simulation]: Secondary | ICD-10-CM

## 2013-12-17 DIAGNOSIS — L02419 Cutaneous abscess of limb, unspecified: Secondary | ICD-10-CM

## 2013-12-17 DIAGNOSIS — R609 Edema, unspecified: Secondary | ICD-10-CM | POA: Insufficient documentation

## 2013-12-17 LAB — POCT CBC
Granulocyte percent: 67.6 %G (ref 37–80)
HCT, POC: 41.6 % — AB (ref 43.5–53.7)
Hemoglobin: 13.3 g/dL — AB (ref 14.1–18.1)
Lymph, poc: 3.3 (ref 0.6–3.4)
MCH, POC: 29.4 pg (ref 27–31.2)
MCHC: 32 g/dL (ref 31.8–35.4)
MCV: 91.9 fL (ref 80–97)
MPV: 7 fL (ref 0–99.8)
POC Granulocyte: 8 — AB (ref 2–6.9)
POC LYMPH PERCENT: 27.7 %L (ref 10–50)
Platelet Count, POC: 281 10*3/uL (ref 142–424)
RBC: 4.5 M/uL — AB (ref 4.69–6.13)
RDW, POC: 16.1 %
WBC: 11.9 10*3/uL — AB (ref 4.6–10.2)

## 2013-12-17 MED ORDER — SULFAMETHOXAZOLE-TMP DS 800-160 MG PO TABS: 1.0000 | ORAL_TABLET | Freq: Two times a day (BID) | ORAL | Status: DC

## 2013-12-17 NOTE — Progress Notes (Signed)
   Subjective:    Patient ID: [name], male    DOB: 02-14-1951, 63 y.o.   MRN: [id number]  HPI This 63 y.o. male presents for evaluation of swollen right lower extremity for 2 days. He has hx of lower extremity edema and cellulitis. He is asking for refills on pain medication and anxiety medicine.  He states he has to fly and will Run out of xanax.  He is c/o anxiety and insomnia.  He states he has been seeing Ortho for his Left knee and they had to put his surgery for his knee on hold and the PA told him to go back to his PCP to get pain medication. He states he has a lot of back pain and he is needing pain meds for His back problems.  Review of Systems C/o right lower extremity edema and back pain and anxiety. No chest pain, SOB, HA, dizziness, vision change, N/V, diarrhea, constipation, dysuria, urinary urgency or frequency, myalgias, arthralgias or rash.     Objective:   Physical Exam  Vital signs noted  Well developed well nourished male.  HEENT - Head atraumatic Normocephalic Respiratory - Lungs CTA bilateral Cardiac - RRR S1 and S2 without murmur GI - Abdomen soft Nontender and bowel sounds active x 4 Extremities - Right lower extremity swollen and erythematous. Positive Homans right leg.      Assessment & Plan:  Cellulitis of leg, right - Plan: CMP14+EGFR, sulfamethoxazole-trimethoprim (BACTRIM DS) 800-160 MG per tablet, US Venous Img Lower Unilateral Right, CANCELED: Lower Extremity Venous Duplex Right, CANCELED: Lower Extremity Venous Duplex Right  Weakness - Plan: POCT CBC, CMP14+EGFR, CANCELED: Lower Extremity Venous Duplex Right, CANCELED: Lower Extremity Venous Duplex Right  Anxiety state, unspecified - Refuse to rx anymore xanax and handout to psychiatric providers given and recommend he see one for his anxiety.  DDD (degenerative disc disease) - I called his Orthopedic doctors office and his note from last visit was read and it states he was referred to  Dr. Carloyn Manner Neurosurgery for his back.  He is still engaged and getting pain medicine from this office and I have referred him to ortho for his pain medicine.  Explained no more pain meds are going to be rx'd from [facility].  Drug-seeking behavior - Dioscussed he needs to go seek help with psychiatry.  No narcotic medicine will be rx'd by myself.  Recommend he see Psychiatry and handed him a list of psychiatric providers.  Lysbeth Penner FNP

## 2013-12-18 ENCOUNTER — Telehealth: Payer: Self-pay | Admitting: Family Medicine

## 2013-12-18 LAB — CMP14+EGFR
ALT: 63 IU/L — ABNORMAL HIGH (ref 0–44)
AST: 43 IU/L — ABNORMAL HIGH (ref 0–40)
Albumin/Globulin Ratio: 2.2 (ref 1.1–2.5)
Albumin: 4.4 g/dL (ref 3.6–4.8)
Alkaline Phosphatase: 48 IU/L (ref 39–117)
BUN/Creatinine Ratio: 13 (ref 10–22)
BUN: 12 mg/dL (ref 8–27)
CO2: 23 mmol/L (ref 18–29)
Calcium: 10.3 mg/dL — ABNORMAL HIGH (ref 8.6–10.2)
Chloride: 99 mmol/L (ref 97–108)
Creatinine, Ser: 0.93 mg/dL (ref 0.76–1.27)
GFR calc Af Amer: 101 mL/min/{1.73_m2} (ref >59)
GFR calc non Af Amer: 88 mL/min/{1.73_m2} (ref >59)
Globulin, Total: 2 g/dL (ref 1.5–4.5)
Glucose: 168 mg/dL — ABNORMAL HIGH (ref 65–99)
Potassium: 4.2 mmol/L (ref 3.5–5.2)
Sodium: 140 mmol/L (ref 134–144)
Total Bilirubin: 0.4 mg/dL (ref 0.0–1.2)
Total Protein: 6.4 g/dL (ref 6.0–8.5)

## 2013-12-18 NOTE — Telephone Encounter (Signed)
Ultrasound negative for DVT.  Lab results have not been reviewed.  He is taking an antibiotic. He will continue to take prescribed medications. We will call next week with official results and recommendations.

## 2013-12-25 ENCOUNTER — Other Ambulatory Visit: Payer: Self-pay | Admitting: Family Medicine

## 2013-12-25 ENCOUNTER — Telehealth: Payer: Self-pay | Admitting: Family Medicine

## 2013-12-25 NOTE — Telephone Encounter (Signed)
Labs mailed

## 2013-12-29 ENCOUNTER — Other Ambulatory Visit: Payer: Self-pay | Admitting: Family Medicine

## 2013-12-29 DIAGNOSIS — L03115 Cellulitis of right lower limb: Secondary | ICD-10-CM

## 2013-12-29 MED ORDER — SULFAMETHOXAZOLE-TMP DS 800-160 MG PO TABS: 1.0000 | ORAL_TABLET | Freq: Two times a day (BID) | ORAL | Status: DC

## 2013-12-30 ENCOUNTER — Encounter: Payer: Self-pay | Admitting: Cardiology

## 2013-12-31 ENCOUNTER — Telehealth: Payer: Self-pay | Admitting: Family Medicine

## 2013-12-31 NOTE — Telephone Encounter (Signed)
A new prescription was sent electronically to the requested pharmacy on 12/29/13.

## 2013-12-31 NOTE — Telephone Encounter (Signed)
Patient aware.

## 2014-01-01 ENCOUNTER — Other Ambulatory Visit: Payer: Self-pay | Admitting: Neurosurgery

## 2014-01-06 ENCOUNTER — Other Ambulatory Visit: Payer: Self-pay | Admitting: Family Medicine

## 2014-01-07 NOTE — Telephone Encounter (Signed)
Received request from pharmacy for [place] but do not see on current med list. Please advise

## 2014-01-12 ENCOUNTER — Other Ambulatory Visit: Payer: Self-pay | Admitting: Family Medicine

## 2014-01-13 NOTE — Telephone Encounter (Signed)
Last ov 12/17/13

## 2014-01-15 ENCOUNTER — Encounter (HOSPITAL_COMMUNITY): Payer: Self-pay | Admitting: Pharmacy Technician

## 2014-01-18 ENCOUNTER — Other Ambulatory Visit: Payer: Self-pay | Admitting: *Deleted

## 2014-01-18 ENCOUNTER — Encounter (HOSPITAL_COMMUNITY): Payer: Self-pay

## 2014-01-18 ENCOUNTER — Other Ambulatory Visit: Payer: Self-pay | Admitting: Family Medicine

## 2014-01-18 ENCOUNTER — Encounter (HOSPITAL_COMMUNITY)
Admission: RE | Admit: 2014-01-18 | Discharge: 2014-01-18 | Disposition: A | Discharge status: Home / Self Care | Payer: BC Managed Care – PPO | Source: Ambulatory Visit | Attending: Orthopedic Surgery | Admitting: Orthopedic Surgery

## 2014-01-18 DIAGNOSIS — Z01812 Encounter for preprocedural laboratory examination: Secondary | ICD-10-CM | POA: Insufficient documentation

## 2014-01-18 DIAGNOSIS — G4730 Sleep apnea, unspecified: Secondary | ICD-10-CM

## 2014-01-18 HISTORY — DX: Unspecified malignant neoplasm of skin, unspecified: C44.90

## 2014-01-18 HISTORY — DX: Cellulitis of right lower limb: L03.115

## 2014-01-18 HISTORY — DX: Personal history of urinary calculi: Z87.442

## 2014-01-18 HISTORY — DX: Frequency of micturition: R35.0

## 2014-01-18 LAB — CBC
HCT: 39.5 % (ref 39.0–52.0)
Hemoglobin: 13.1 g/dL (ref 13.0–17.0)
MCH: 30.7 pg (ref 26.0–34.0)
MCHC: 33.2 g/dL (ref 30.0–36.0)
MCV: 92.5 fL (ref 78.0–100.0)
PLATELETS: 277 10*3/uL (ref 150–400)
RBC: 4.27 MIL/uL (ref 4.22–5.81)
RDW: 14.3 % (ref 11.5–15.5)
WBC: 11 10*3/uL — AB (ref 4.0–10.5)

## 2014-01-18 LAB — BASIC METABOLIC PANEL
BUN: 14 mg/dL (ref 6–23)
CO2: 22 meq/L (ref 19–32)
CREATININE: 0.71 mg/dL (ref 0.50–1.35)
Calcium: 9.3 mg/dL (ref 8.4–10.5)
Chloride: 97 mEq/L (ref 96–112)
GFR calc non Af Amer: >90 mL/min (ref >90)
Glucose, Bld: 306 mg/dL — ABNORMAL HIGH (ref 70–99)
Potassium: 5.2 mEq/L (ref 3.7–5.3)
SODIUM: 136 meq/L — AB (ref 137–147)

## 2014-01-18 LAB — TYPE AND SCREEN
ABO/RH(D): A POS
ANTIBODY SCREEN: NEGATIVE

## 2014-01-18 LAB — SURGICAL PCR SCREEN
MRSA, PCR: NEGATIVE
Staphylococcus aureus: NEGATIVE

## 2014-01-18 NOTE — Progress Notes (Signed)
01/18/14 1250  OBSTRUCTIVE SLEEP APNEA  Have you ever been diagnosed with sleep apnea through a sleep study? No  Do you snore loudly (loud enough to be heard through closed doors)?  0  Do you often feel tired, fatigued, or sleepy during the daytime? 0  Has anyone observed you stop breathing during your sleep? 0  Do you have, or are you being treated for high blood pressure? 1  BMI more than 35 kg/m2? 1  Age over 63 years old? 1  Neck circumference greater than 40 cm/18 inches? 1  Gender: 1  Obstructive Sleep Apnea Score 5  Score 4 or greater  Results sent to PCP   This patient has screened at risk for sleep apnea using the STOP Bang tool during a pre-surgical visit. A score of 4 or greater is at risk for sleep apnea.

## 2014-01-20 ENCOUNTER — Encounter (HOSPITAL_COMMUNITY): Payer: Self-pay | Admitting: Vascular Surgery

## 2014-01-20 NOTE — Progress Notes (Signed)
Anesthesia Chart Review:  Patient is a 63 year old male scheduled for L3-4, L4-5 laminectomies with PLIF on 02/01/14 by Dr. Arnoldo Morale.   History includes HTN, DM2, non-smoker, skin cancer, nephrolithiasis, depression, hypercholesterolemia, hypertriglyceridemia, DJD, RLE cellulitis 12/17/2013. BMI is ~ 42 consistent with morbid obesity. OSA screening score was 5.  PCP is Dr. Redge Gainer with [facility]. Cardiologist is Dr. Ena Dawley who saw him for a preoperative cardiology evaluation for left TKA on 11/02/13. She had cleared him for that procedure without further preoperative cardiology work-up.  However, I believe that surgery was ultimately placed on hold for unclear reasons (due to cellulitis?).  EKG on 11/02/13 showed ST.    He had a cardiac cath on 03/31/09 (done for admission for chest pain) that showed: Mild nonobstructive CAD with 40% narrowing in the proximal right coronary artery and no significant obstruction in the left anterior descending and circumflex arteries. Normal pulmonary artery pressures. LVEF 60%.  2V CXR on 03/11/13 showed: Low volumes with crowding of bronchovascular markings and probable basilar atelectasis. 1V CXR on 05/14/13 showed: No acute abnormality noted.  Preoperative labs noted.  Non-fasting glucose was 306. K 5.2, Cr 0.71.  WBC 11.0, H/H 13.1/39.5.  His A1C on 08/28/13 was 6.6. He reports that he had just eaten lunch before his PAT visit and that typically his fasting glucose levels run ~ 100-115.  His RLE cellulitis has since cleared.  He was seen by cardiology within the past six months with no new testing ordered.  His non-fasting glucose was elevated at PAT, but with A1C < 7 within the past six months.  His cellulitis has resolved and he reported fasting glucose is running < 150.  He will get a fasting CBG on arrival.  If results are reasonable and otherwise no acute changes then I would anticipate that he could proceed as planned.  George Hugh [facility]/Anesthesiology Phone [phone] 01/20/2014 1:10 PM

## 2014-01-25 ENCOUNTER — Encounter (HOSPITAL_COMMUNITY): Admission: RE | Payer: Self-pay | Source: Ambulatory Visit

## 2014-01-25 ENCOUNTER — Inpatient Hospital Stay (HOSPITAL_COMMUNITY)
Admission: RE | Admit: 2014-01-25 | Payer: BC Managed Care – PPO | Source: Ambulatory Visit | Admitting: Orthopedic Surgery

## 2014-01-25 SURGERY — ARTHROPLASTY, KNEE, TOTAL
Anesthesia: General | Laterality: Left

## 2014-02-01 ENCOUNTER — Inpatient Hospital Stay (HOSPITAL_COMMUNITY): Admission: RE | Admit: 2014-02-01 | Payer: BC Managed Care – PPO | Source: Ambulatory Visit | Admitting: Neurosurgery

## 2014-02-01 ENCOUNTER — Encounter (HOSPITAL_COMMUNITY): Admission: RE | Payer: Self-pay | Source: Ambulatory Visit

## 2014-02-01 SURGERY — POSTERIOR LUMBAR FUSION 2 LEVEL
Anesthesia: General

## 2014-02-05 DEATH — deceased

## 2015-11-29 IMAGING — US US EXTREM LOW VENOUS*R*
1 series · 14 of 24 positions shown · non-contrast
Comparison: None.

CLINICAL DATA: Leg pain and edema

EXAM:
RIGHT LOWER EXTREMITY VENOUS DOPPLER ULTRASOUND
TECHNIQUE: Gray-scale sonography with graded compression, as well as color
Doppler and duplex ultrasound, were performed to evaluate the deep
venous system from the level of the common femoral vein through the
popliteal and proximal calf veins. Spectral Doppler was utilized to
evaluate flow at rest and with distal augmentation maneuvers.

[Series 1: us extrem low venous*right* · 0.11mm/px · 14 of 29 slices shown]
[im 1/29]
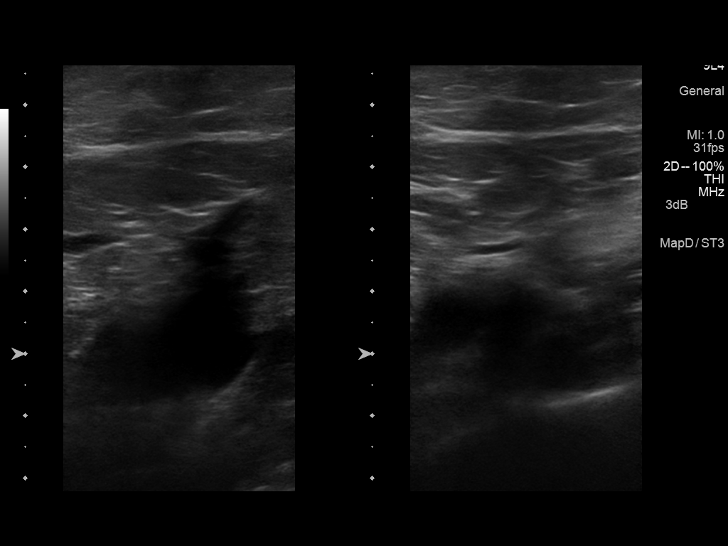
[im 3/29]
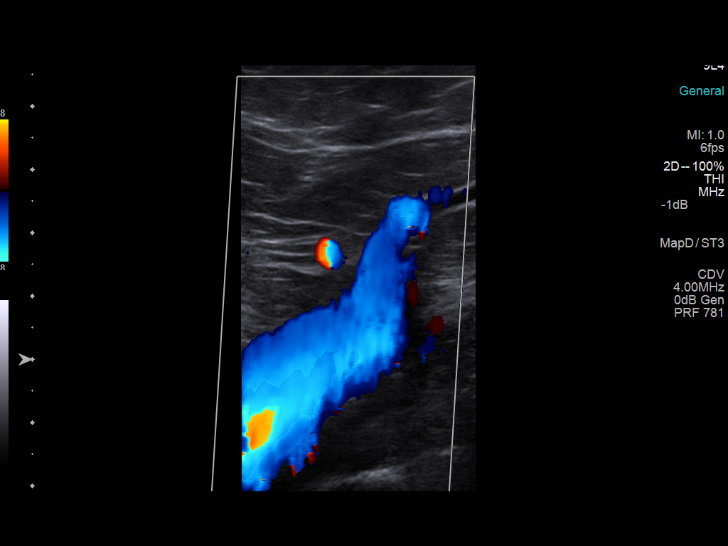
[im 5/29]
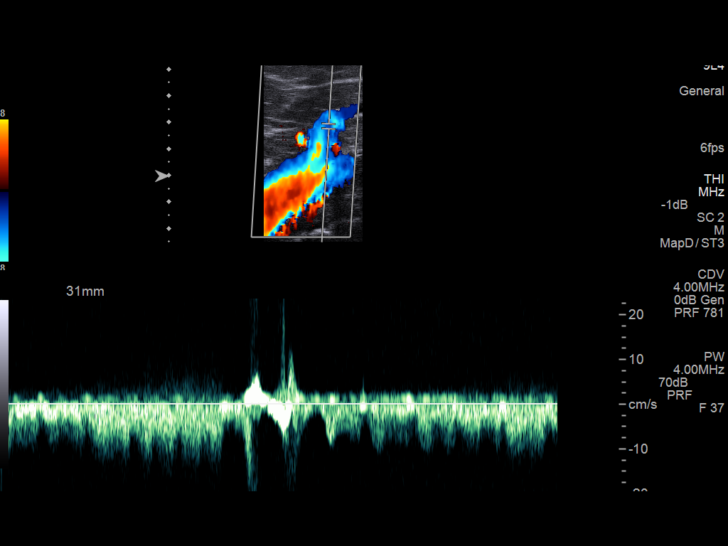
[im 8/29]
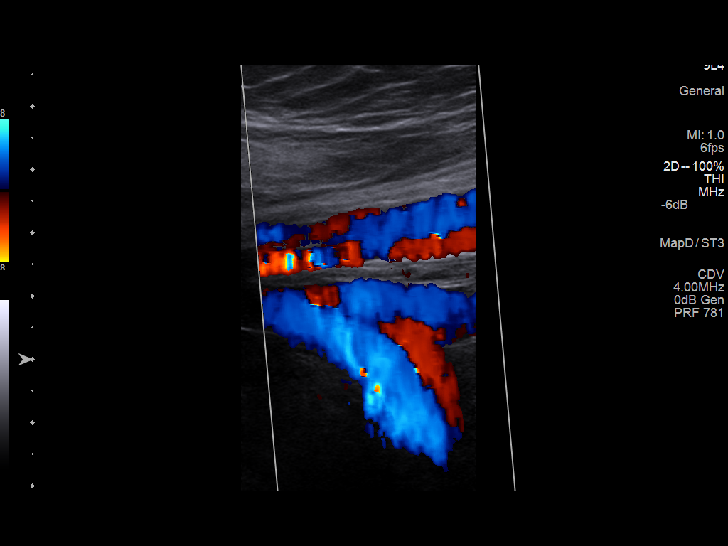
[im 9/29]
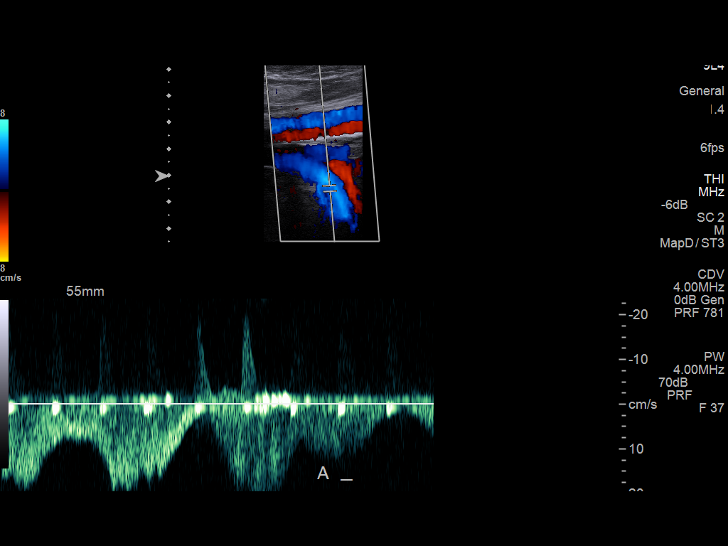
[im 11/29]
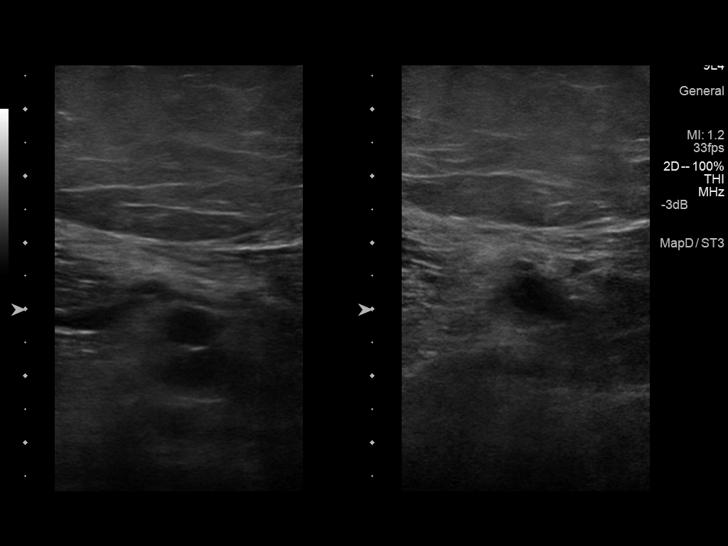
[im 14/29]
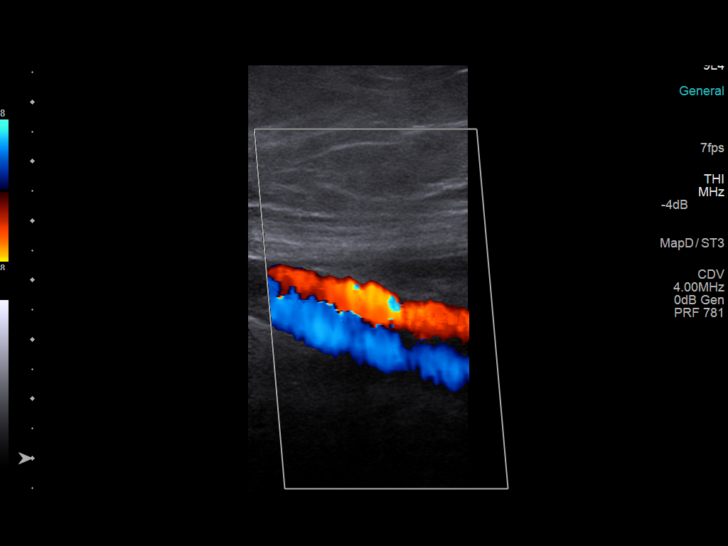
[im 15/29]
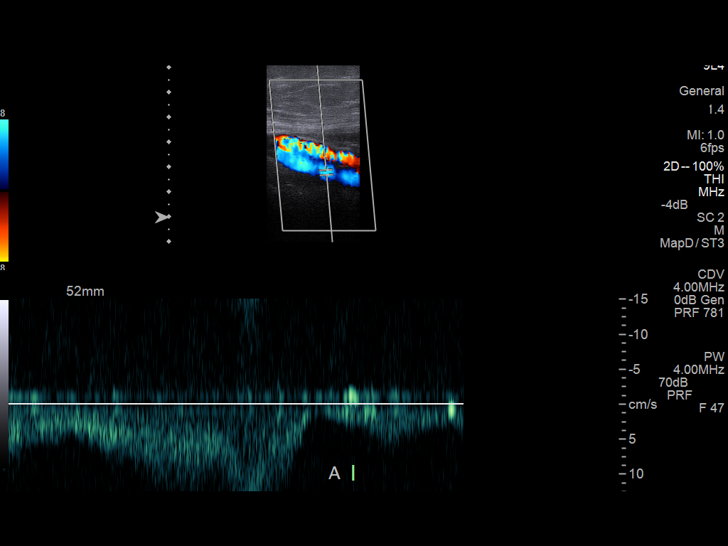
[im 18/29]
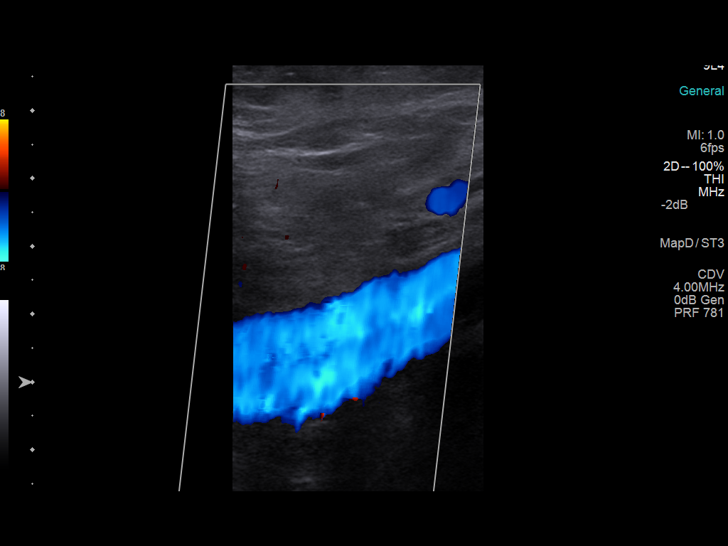
[im 20/29]
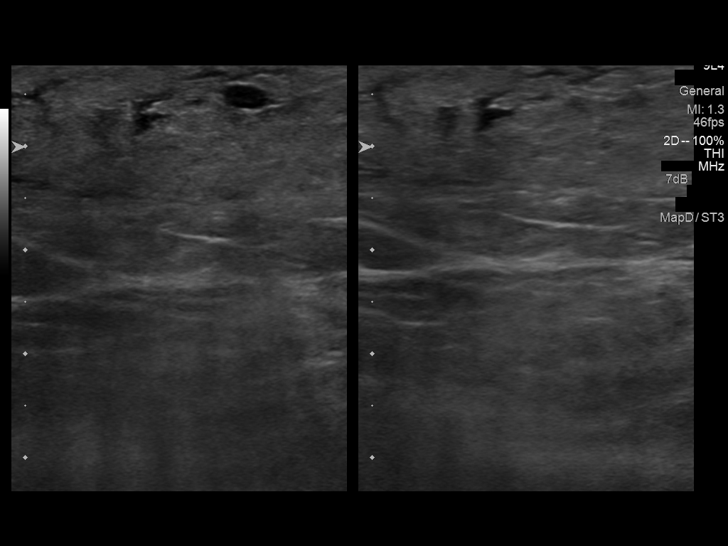
[im 22/29]
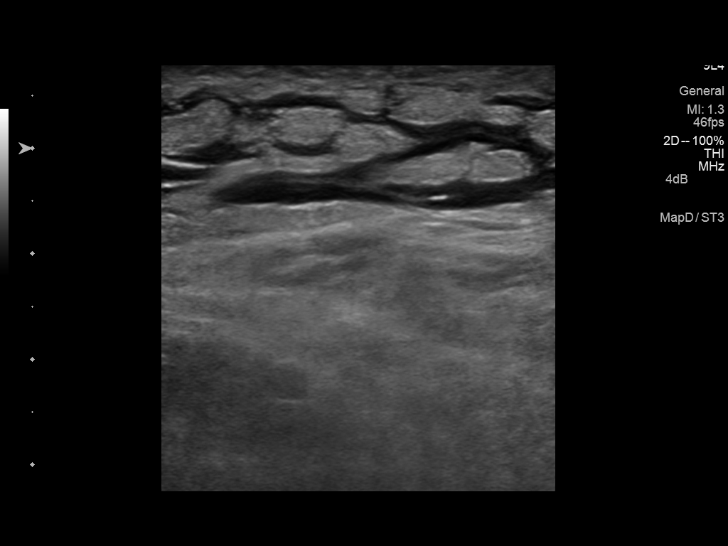
[im 24/29]
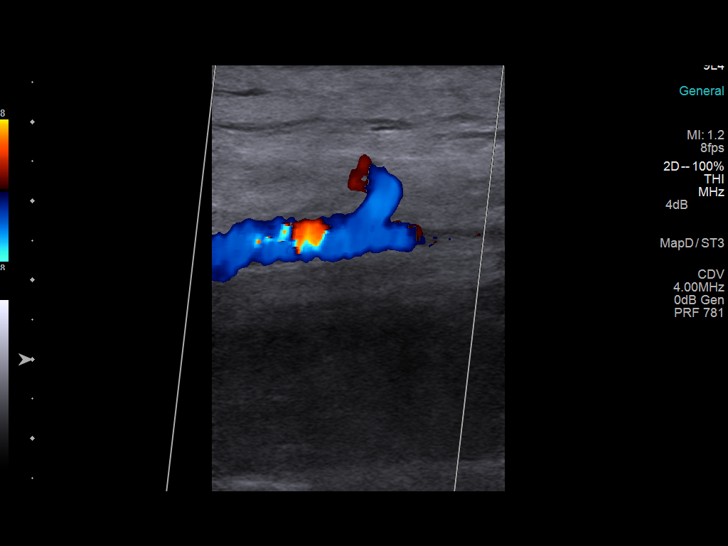
[im 26/29]
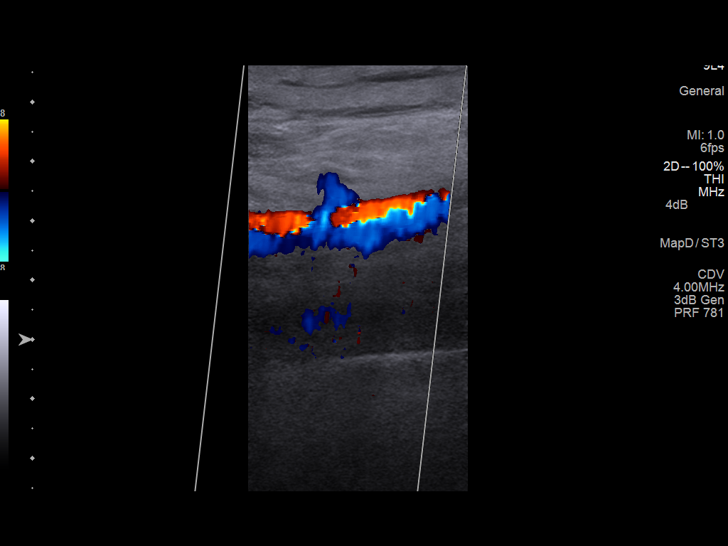
[im 29/29]
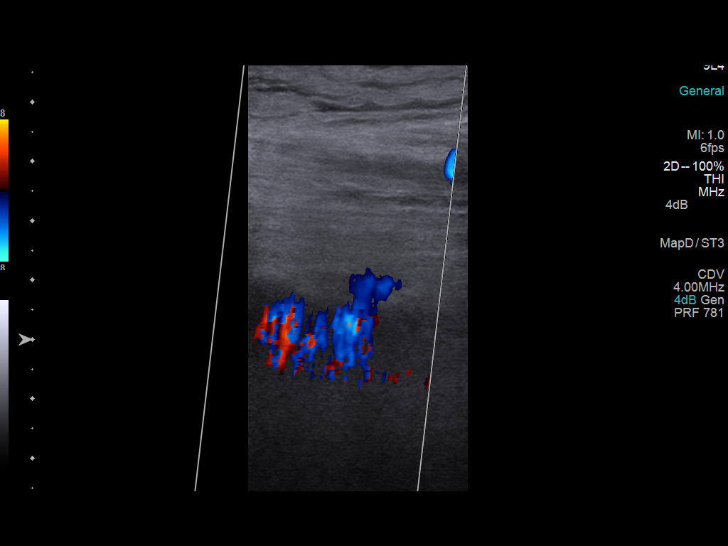

[14 of 24 positions shown; findings below may reference images not displayed]

FINDINGS: Thrombus within deep veins:  None visualized.

Compressibility of deep veins:  Normal.

Duplex waveform respiratory phasicity:  Normal.

Duplex waveform response to augmentation:  Normal.

Venous reflux:  None visualized.

Other findings: Right lower extremity calf edema noted in the
subcutaneous tissues.
IMPRESSION: Negative for DVT.  Subcutaneous calf edema
